# Patient Record
Sex: Male | Born: 1965 | Race: White | Hispanic: No | Marital: Single | Smoking: Never smoker
Health system: Southern US, Community
[De-identification: ages and names within clinical notes are randomized; demographics above are authoritative.]

---

## 2015-05-23 ENCOUNTER — Telehealth: Payer: Self-pay | Admitting: General Practice

## 2015-05-23 NOTE — Telephone Encounter (Signed)
Pt stopped by and is needing a doctor who speaks russian. Can you take him on as a new patient? Please advise

## 2015-05-23 NOTE — Telephone Encounter (Signed)
Ok Thx 

## 2015-05-24 NOTE — Telephone Encounter (Signed)
Unable to leave msg. vm not set up

## 2015-05-24 NOTE — Telephone Encounter (Signed)
Pt informed

## 2016-08-22 NOTE — Telephone Encounter (Signed)
Patient stopped in office wanting to know if you would still accept him as a patient. He never set up one before. Please advise. Thank you.

## 2016-08-31 NOTE — Telephone Encounter (Signed)
OK. Thx

## 2016-09-02 NOTE — Telephone Encounter (Signed)
Patient has an appointment on June 15 at 315pm to establish care.

## 2016-09-13 ENCOUNTER — Other Ambulatory Visit: Payer: 59

## 2016-09-13 ENCOUNTER — Ambulatory Visit (INDEPENDENT_AMBULATORY_CARE_PROVIDER_SITE_OTHER): Payer: 59 | Admitting: Internal Medicine

## 2016-09-13 ENCOUNTER — Encounter: Payer: Self-pay | Admitting: Internal Medicine

## 2016-09-13 VITALS — BP 108/74 | HR 79 | Temp 98.1°F | Ht 72.0 in | Wt 213.0 lb

## 2016-09-13 DIAGNOSIS — D485 Neoplasm of uncertain behavior of skin: Secondary | ICD-10-CM | POA: Diagnosis not present

## 2016-09-13 DIAGNOSIS — Z0000 Encounter for general adult medical examination without abnormal findings: Secondary | ICD-10-CM

## 2016-09-13 DIAGNOSIS — Z23 Encounter for immunization: Secondary | ICD-10-CM | POA: Diagnosis not present

## 2016-09-13 DIAGNOSIS — H0260 Xanthelasma of unspecified eye, unspecified eyelid: Secondary | ICD-10-CM | POA: Insufficient documentation

## 2016-09-13 DIAGNOSIS — R202 Paresthesia of skin: Secondary | ICD-10-CM | POA: Diagnosis not present

## 2016-09-13 NOTE — Assessment & Plan Note (Signed)
Skin bx 

## 2016-09-13 NOTE — Progress Notes (Signed)
   Subjective:  Patient ID: Craig Atkins, male    DOB: [birth year]  Age: 51 y.o. MRN: [id number]  CC: No chief complaint on file.   HPI Craig Atkins presents for a new pt well exam C/o L arm and hand numbness at work, better w/arm position change C/o skin lesions C/o LBP x 1 week - dull ache - resolved  No outpatient prescriptions prior to visit.   No facility-administered medications prior to visit.     ROS Review of Systems  Constitutional: Negative for appetite change, fatigue and unexpected weight change.  HENT: Negative for congestion, nosebleeds, sneezing, sore throat and trouble swallowing.   Eyes: Negative for itching and visual disturbance.  Respiratory: Negative for cough.   Cardiovascular: Negative for chest pain, palpitations and leg swelling.  Gastrointestinal: Negative for abdominal distention, blood in stool, diarrhea and nausea.  Genitourinary: Negative for frequency and hematuria.  Musculoskeletal: Positive for back pain. Negative for gait problem, joint swelling and neck pain.  Skin: Negative for rash.  Neurological: Positive for numbness. Negative for dizziness, tremors, speech difficulty and weakness.  Psychiatric/Behavioral: Negative for agitation, dysphoric mood and sleep disturbance. The patient is not nervous/anxious.     Objective:  BP 108/74 (BP Location: Left Arm, Patient Position: Sitting, Cuff Size: Normal)   Pulse 79   Temp 98.1 F (36.7 C) (Oral)   Ht 6' (1.829 m)   Wt 213 lb (96.6 kg)   SpO2 99%   BMI 28.89 kg/m   BP Readings from Last 3 Encounters:  09/13/16 108/74     Wt Readings from Last 3 Encounters:  09/13/16 213 lb (96.6 kg)    Physical Exam  Constitutional: He is oriented to person, place, and time. He appears well-developed. No distress.  NAD  HENT:  Mouth/Throat: Oropharynx is clear and moist.  Eyes: Conjunctivae are normal. Pupils are equal, round, and reactive to light.  Neck: Normal range of motion. No JVD present.  No thyromegaly present.  Cardiovascular: Normal rate, regular rhythm, normal heart sounds and intact distal pulses.  Exam reveals no gallop and no friction rub.   No murmur heard. Pulmonary/Chest: Effort normal and breath sounds normal. No respiratory distress. He has no wheezes. He has no rales. He exhibits no tenderness.  Abdominal: Soft. Bowel sounds are normal. He exhibits no distension and no mass. There is no tenderness. There is no rebound and no guarding.  Musculoskeletal: Normal range of motion. He exhibits no edema or tenderness.  Lymphadenopathy:    He has no cervical adenopathy.  Neurological: He is alert and oriented to person, place, and time. He has normal reflexes. No cranial nerve deficit. He exhibits normal muscle tone. He displays a negative Romberg sign. Coordination and gait normal.  Skin: Skin is warm and dry. No rash noted.  Psychiatric: He has a normal mood and affect. His behavior is normal. Judgment and thought content normal.  moles on neck Xanthelasma on L upper eyelid Gynecomastia B. Obese LS NT  No results found for: WBC, HGB, HCT, PLT, GLUCOSE, CHOL, TRIG, HDL, LDLDIRECT, LDLCALC, ALT, AST, NA, K, CL, CREATININE, BUN, CO2, TSH, PSA, INR, GLUF, HGBA1C, MICROALBUR  Patient was never admitted.  Assessment & Plan:   There are no diagnoses linked to this encounter. Craig Atkins does not currently have medications on file.  No orders of the defined types were placed in this encounter.    Follow-up: No Follow-up on file.  Walker Kehr, MD

## 2016-09-13 NOTE — Assessment & Plan Note (Addendum)
Compression neuropathy, ulnar Labs  Ergonomic work station

## 2016-09-13 NOTE — Assessment & Plan Note (Addendum)
  We discussed age appropriate health related issues, including available/recomended screening tests and vaccinations. We discussed a need for adhering to healthy diet and exercise. Labs were ordered to be later reviewed . All questions were answered.  tDAP

## 2016-09-13 NOTE — Patient Instructions (Addendum)
Compression neuropathy, ulnar  Skin bx w/me  [facility]

## 2016-09-13 NOTE — Assessment & Plan Note (Addendum)
L upper eyelid Labs Ophth ref suggested - Dr Alfonso Patten

## 2016-09-17 ENCOUNTER — Other Ambulatory Visit (INDEPENDENT_AMBULATORY_CARE_PROVIDER_SITE_OTHER): Payer: 59

## 2016-09-17 DIAGNOSIS — R202 Paresthesia of skin: Secondary | ICD-10-CM | POA: Diagnosis not present

## 2016-09-17 DIAGNOSIS — Z0000 Encounter for general adult medical examination without abnormal findings: Secondary | ICD-10-CM

## 2016-09-17 LAB — URINALYSIS
Bilirubin Urine: NEGATIVE
HGB URINE DIPSTICK: NEGATIVE
Ketones, ur: NEGATIVE
Leukocytes, UA: NEGATIVE
Nitrite: NEGATIVE
PH: 6 (ref 5.0–8.0)
SPECIFIC GRAVITY, URINE: 1.025 (ref 1.000–1.030)
TOTAL PROTEIN, URINE-UPE24: NEGATIVE
URINE GLUCOSE: NEGATIVE
Urobilinogen, UA: 0.2 (ref 0.0–1.0)

## 2016-09-17 LAB — CBC WITH DIFFERENTIAL/PLATELET
Basophils Absolute: 0 10*3/uL (ref 0.0–0.1)
Basophils Relative: 0.4 % (ref 0.0–3.0)
EOS PCT: 1 % (ref 0.0–5.0)
Eosinophils Absolute: 0.1 10*3/uL (ref 0.0–0.7)
HEMATOCRIT: 44.9 % (ref 39.0–52.0)
HEMOGLOBIN: 15.5 g/dL (ref 13.0–17.0)
LYMPHS PCT: 28.1 % (ref 12.0–46.0)
Lymphs Abs: 1.5 10*3/uL (ref 0.7–4.0)
MCHC: 34.5 g/dL (ref 30.0–36.0)
MCV: 89.6 fl (ref 78.0–100.0)
MONOS PCT: 9.1 % (ref 3.0–12.0)
Monocytes Absolute: 0.5 10*3/uL (ref 0.1–1.0)
Neutro Abs: 3.2 10*3/uL (ref 1.4–7.7)
Neutrophils Relative %: 61.4 % (ref 43.0–77.0)
Platelets: 204 10*3/uL (ref 150.0–400.0)
RBC: 5.01 Mil/uL (ref 4.22–5.81)
RDW: 13 % (ref 11.5–15.5)
WBC: 5.3 10*3/uL (ref 4.0–10.5)

## 2016-09-17 LAB — HEPATIC FUNCTION PANEL
ALT: 20 U/L (ref 0–53)
AST: 15 U/L (ref 0–37)
Albumin: 4.3 g/dL (ref 3.5–5.2)
Alkaline Phosphatase: 56 U/L (ref 39–117)
BILIRUBIN DIRECT: 0.1 mg/dL (ref 0.0–0.3)
TOTAL PROTEIN: 6.4 g/dL (ref 6.0–8.3)
Total Bilirubin: 0.6 mg/dL (ref 0.2–1.2)

## 2016-09-17 LAB — BASIC METABOLIC PANEL
BUN: 15 mg/dL (ref 6–23)
CHLORIDE: 105 meq/L (ref 96–112)
CO2: 30 meq/L (ref 19–32)
CREATININE: 0.85 mg/dL (ref 0.40–1.50)
Calcium: 9.4 mg/dL (ref 8.4–10.5)
GFR: 101.42 mL/min (ref >60.00)
Glucose, Bld: 108 mg/dL — ABNORMAL HIGH (ref 70–99)
POTASSIUM: 3.9 meq/L (ref 3.5–5.1)
Sodium: 142 mEq/L (ref 135–145)

## 2016-09-17 LAB — LIPID PANEL
CHOL/HDL RATIO: 6
Cholesterol: 217 mg/dL — ABNORMAL HIGH (ref 0–200)
HDL: 35.9 mg/dL — AB (ref >39.00)
LDL CALC: 147 mg/dL — AB (ref 0–99)
NonHDL: 181.15
Triglycerides: 169 mg/dL — ABNORMAL HIGH (ref 0.0–149.0)
VLDL: 33.8 mg/dL (ref 0.0–40.0)

## 2016-09-17 LAB — TSH: TSH: 0.63 u[IU]/mL (ref 0.35–4.50)

## 2016-09-17 LAB — VITAMIN B12: Vitamin B-12: 182 pg/mL — ABNORMAL LOW (ref 211–911)

## 2016-09-17 LAB — PSA: PSA: 2.1 ng/mL (ref 0.10–4.00)

## 2016-09-23 ENCOUNTER — Other Ambulatory Visit: Payer: Self-pay | Admitting: Internal Medicine

## 2016-09-23 MED ORDER — CYANOCOBALAMIN 500 MCG/0.1ML NA SOLN: 500.0000 ug | NASAL | 11 refills | Status: DC

## 2016-09-23 MED ORDER — VITAMIN D3 50 MCG (2000 UT) PO CAPS: 2000.0000 [IU] | ORAL_CAPSULE | Freq: Every day | ORAL | 3 refills | Status: DC

## 2016-10-15 ENCOUNTER — Ambulatory Visit (INDEPENDENT_AMBULATORY_CARE_PROVIDER_SITE_OTHER): Payer: 59 | Admitting: Internal Medicine

## 2016-10-15 ENCOUNTER — Other Ambulatory Visit: Payer: 59

## 2016-10-15 ENCOUNTER — Encounter: Payer: Self-pay | Admitting: Internal Medicine

## 2016-10-15 VITALS — BP 110/72 | HR 54 | Temp 99.0°F | Ht 72.0 in | Wt 209.0 lb

## 2016-10-15 DIAGNOSIS — D485 Neoplasm of uncertain behavior of skin: Secondary | ICD-10-CM | POA: Diagnosis not present

## 2016-10-15 DIAGNOSIS — E538 Deficiency of other specified B group vitamins: Secondary | ICD-10-CM | POA: Diagnosis not present

## 2016-10-15 NOTE — Assessment & Plan Note (Signed)
On B12 SL

## 2016-10-15 NOTE — Progress Notes (Signed)
Subjective:  Patient ID: Craig Atkins, male    DOB: 1966-01-31  Age: 51 y.o. MRN: [id number]  CC: No chief complaint on file.   HPI Craig Atkins presents for B12 def f/u and for a skin bx  Outpatient Medications Prior to Visit  Medication Sig Dispense Refill  . Cholecalciferol (VITAMIN D3) 2000 units capsule Take 1 capsule (2,000 Units total) by mouth daily. 100 capsule 3  . Cyanocobalamin (NASCOBAL) 500 MCG/0.1ML SOLN Place 0.1 mLs (500 mcg total) into the nose once a week. 1.3 mL 11   No facility-administered medications prior to visit.     ROS Review of Systems  Constitutional: Negative for fatigue and fever.  Musculoskeletal: Negative for arthralgias and myalgias.    Objective:  BP 110/72 (BP Location: Left Arm, Patient Position: Sitting, Cuff Size: Large)   Pulse (!) 54   Temp 99 F (37.2 C) (Oral)   Ht 6' (1.829 m)   Wt 209 lb (94.8 kg)   SpO2 99%   BMI 28.35 kg/m   BP Readings from Last 3 Encounters:  10/15/16 110/72  09/13/16 108/74    Wt Readings from Last 3 Encounters:  10/15/16 209 lb (94.8 kg)  09/13/16 213 lb (96.6 kg)    Physical Exam  Constitutional: No distress.  Musculoskeletal: He exhibits no tenderness.  Skin: No rash noted. No erythema. No pallor.    Procedure Note :     Procedure :  Skin biopsy   Indication: Suspicious lesion(s) and tags   Risks including unsuccessful procedure , bleeding, infection, bruising, scar, a need for another complete procedure and others were explained to the patient in detail as well as the benefits. Informed consent was obtained.  The patient was placed in a decubitus position.  Lesion #1 on  L neck   measuring   6x3  mm   Skin over lesion #1  was prepped with Betadine and alcohol  and anesthetized with 1/2 cc of 2% lidocaine and epinephrine, using a 25-gauge 1 inch needle.  Shave biopsy with a sterile Dermablade was carried out in the usual fashion. Hyfrecator was used to destroy the rest of the  lesion potentially left behind and for hemostasis. Band-Aid was applied with antibiotic ointment.    Lesion #2 on  L neck   measuring 2x2  mm   Skin over lesion #2  was prepped with Betadine and alcohol  and anesthetized with 1 cc of 2% lidocaine and epinephrine, using a 25-gauge 1 inch needle.  Shave biopsy with a sterile Dermablade was carried out in the usual fashion. Hyfrecator was used to destroy the rest of the lesion potentially left behind and for hemostasis. Band-Aid was applied with antibiotic ointment. Lesion discarded.  Lesion #3-6 on R neck   measuring  1x1 mm removed w/hyfracator at no charge     Tolerated well. Complications none.    Lab Results  Component Value Date   WBC 5.3 09/17/2016   HGB 15.5 09/17/2016   HCT 44.9 09/17/2016   PLT 204.0 09/17/2016   GLUCOSE 108 (H) 09/17/2016   CHOL 217 (H) 09/17/2016   TRIG 169.0 (H) 09/17/2016   HDL 35.90 (L) 09/17/2016   LDLCALC 147 (H) 09/17/2016   ALT 20 09/17/2016   AST 15 09/17/2016   NA 142 09/17/2016   K 3.9 09/17/2016   CL 105 09/17/2016   CREATININE 0.85 09/17/2016   BUN 15 09/17/2016   CO2 30 09/17/2016   TSH 0.63 09/17/2016   PSA 2.10 09/17/2016  Patient was never admitted.  Assessment & Plan:   There are no diagnoses linked to this encounter. I am having Mr. Dorame maintain his Vitamin D3 and Cyanocobalamin.  Meds ordered this encounter  Medications  . Cyanocobalamin 2500 MCG SUBL    Sig: Use as directed in the mouth or throat daily.     Follow-up: No Follow-up on file.  Walker Kehr, MD

## 2016-10-15 NOTE — Assessment & Plan Note (Signed)
See procedure Tags removed at no charge

## 2016-10-16 ENCOUNTER — Telehealth: Payer: Self-pay | Admitting: Internal Medicine

## 2016-10-16 NOTE — Telephone Encounter (Signed)
The lab called needing the location of the skin biopsy. Sh was informed it was the L side of the neck.

## 2017-06-30 ENCOUNTER — Other Ambulatory Visit: Payer: Self-pay | Admitting: Internal Medicine

## 2017-09-16 ENCOUNTER — Ambulatory Visit (INDEPENDENT_AMBULATORY_CARE_PROVIDER_SITE_OTHER): Payer: 59 | Admitting: Internal Medicine

## 2017-09-16 ENCOUNTER — Encounter: Payer: Self-pay | Admitting: Internal Medicine

## 2017-09-16 ENCOUNTER — Other Ambulatory Visit (INDEPENDENT_AMBULATORY_CARE_PROVIDER_SITE_OTHER): Payer: 59

## 2017-09-16 ENCOUNTER — Ambulatory Visit (INDEPENDENT_AMBULATORY_CARE_PROVIDER_SITE_OTHER)
Admission: RE | Admit: 2017-09-16 | Discharge: 2017-09-16 | Disposition: A | Discharge status: Home / Self Care | Payer: 59 | Source: Ambulatory Visit | Attending: Internal Medicine | Admitting: Internal Medicine

## 2017-09-16 VITALS — BP 108/72 | HR 58 | Temp 97.7°F | Ht 72.0 in | Wt 205.0 lb

## 2017-09-16 DIAGNOSIS — R05 Cough: Secondary | ICD-10-CM | POA: Insufficient documentation

## 2017-09-16 DIAGNOSIS — E538 Deficiency of other specified B group vitamins: Secondary | ICD-10-CM | POA: Diagnosis not present

## 2017-09-16 DIAGNOSIS — J301 Allergic rhinitis due to pollen: Secondary | ICD-10-CM | POA: Diagnosis not present

## 2017-09-16 DIAGNOSIS — Z0000 Encounter for general adult medical examination without abnormal findings: Secondary | ICD-10-CM

## 2017-09-16 DIAGNOSIS — J309 Allergic rhinitis, unspecified: Secondary | ICD-10-CM | POA: Insufficient documentation

## 2017-09-16 DIAGNOSIS — R059 Cough, unspecified: Secondary | ICD-10-CM

## 2017-09-16 LAB — BASIC METABOLIC PANEL
BUN: 20 mg/dL (ref 6–23)
CO2: 29 mEq/L (ref 19–32)
CREATININE: 0.89 mg/dL (ref 0.40–1.50)
Calcium: 9.6 mg/dL (ref 8.4–10.5)
Chloride: 106 mEq/L (ref 96–112)
GFR: 95.8 mL/min (ref >60.00)
Glucose, Bld: 86 mg/dL (ref 70–99)
POTASSIUM: 3.8 meq/L (ref 3.5–5.1)
Sodium: 142 mEq/L (ref 135–145)

## 2017-09-16 LAB — CBC WITH DIFFERENTIAL/PLATELET
Basophils Absolute: 0 10*3/uL (ref 0.0–0.1)
Basophils Relative: 0.5 % (ref 0.0–3.0)
Eosinophils Absolute: 0.1 10*3/uL (ref 0.0–0.7)
Eosinophils Relative: 1.3 % (ref 0.0–5.0)
HCT: 46.2 % (ref 39.0–52.0)
Hemoglobin: 16.2 g/dL (ref 13.0–17.0)
LYMPHS ABS: 1.7 10*3/uL (ref 0.7–4.0)
Lymphocytes Relative: 27.1 % (ref 12.0–46.0)
MCHC: 35.1 g/dL (ref 30.0–36.0)
MCV: 89.9 fl (ref 78.0–100.0)
MONO ABS: 0.6 10*3/uL (ref 0.1–1.0)
Monocytes Relative: 9.7 % (ref 3.0–12.0)
NEUTROS ABS: 3.8 10*3/uL (ref 1.4–7.7)
NEUTROS PCT: 61.4 % (ref 43.0–77.0)
PLATELETS: 199 10*3/uL (ref 150.0–400.0)
RBC: 5.14 Mil/uL (ref 4.22–5.81)
RDW: 13.3 % (ref 11.5–15.5)
WBC: 6.2 10*3/uL (ref 4.0–10.5)

## 2017-09-16 LAB — HEPATIC FUNCTION PANEL
ALBUMIN: 4.5 g/dL (ref 3.5–5.2)
ALT: 24 U/L (ref 0–53)
AST: 13 U/L (ref 0–37)
Alkaline Phosphatase: 63 U/L (ref 39–117)
BILIRUBIN DIRECT: 0.1 mg/dL (ref 0.0–0.3)
Total Bilirubin: 0.5 mg/dL (ref 0.2–1.2)
Total Protein: 7 g/dL (ref 6.0–8.3)

## 2017-09-16 LAB — URINALYSIS
Bilirubin Urine: NEGATIVE
Hgb urine dipstick: NEGATIVE
Ketones, ur: NEGATIVE
Leukocytes, UA: NEGATIVE
Nitrite: NEGATIVE
PH: 5.5 (ref 5.0–8.0)
TOTAL PROTEIN, URINE-UPE24: NEGATIVE
URINE GLUCOSE: NEGATIVE
Urobilinogen, UA: 0.2 (ref 0.0–1.0)

## 2017-09-16 LAB — H. PYLORI ANTIBODY, IGG: H PYLORI IGG: NEGATIVE

## 2017-09-16 LAB — VITAMIN B12: Vitamin B-12: 945 pg/mL — ABNORMAL HIGH (ref 211–911)

## 2017-09-16 LAB — LIPID PANEL
CHOLESTEROL: 234 mg/dL — AB (ref 0–200)
HDL: 41.5 mg/dL (ref >39.00)
NonHDL: 192.33
TRIGLYCERIDES: 210 mg/dL — AB (ref 0.0–149.0)
Total CHOL/HDL Ratio: 6
VLDL: 42 mg/dL — ABNORMAL HIGH (ref 0.0–40.0)

## 2017-09-16 LAB — PSA: PSA: 2.69 ng/mL (ref 0.10–4.00)

## 2017-09-16 LAB — VITAMIN D 25 HYDROXY (VIT D DEFICIENCY, FRACTURES): VITD: 21.94 ng/mL — AB (ref 30.00–100.00)

## 2017-09-16 LAB — LDL CHOLESTEROL, DIRECT: Direct LDL: 155 mg/dL

## 2017-09-16 LAB — TSH: TSH: 0.7 u[IU]/mL (ref 0.35–4.50)

## 2017-09-16 MED ORDER — LORATADINE 10 MG PO TABS: 10.0000 mg | ORAL_TABLET | Freq: Every day | ORAL | 11 refills | Status: DC

## 2017-09-16 MED ORDER — FLUTICASONE FUROATE-VILANTEROL 100-25 MCG/INH IN AEPB: 1.0000 | INHALATION_SPRAY | Freq: Every day | RESPIRATORY_TRACT | 5 refills | Status: DC

## 2017-09-16 NOTE — Progress Notes (Signed)
Subjective:  Patient ID: Craig Atkins, male    DOB: 1966-02-24  Age: 52 y.o. MRN: [id number]  CC: No chief complaint on file.   HPI Craig Atkins presents for a well exam C/o several episodes of cough and chills periodically  Outpatient Medications Prior to Visit  Medication Sig Dispense Refill  . [employer] D3 2000 units CAPS TAKE ONE CAPSULE BY MOUTH EVERY DAY 90 capsule 3  . Cyanocobalamin 2500 MCG SUBL Use as directed in the mouth or throat daily.     No facility-administered medications prior to visit.     ROS: Review of Systems  Constitutional: Negative for appetite change, fatigue and unexpected weight change.  HENT: Negative for congestion, nosebleeds, sneezing, sore throat and trouble swallowing.   Eyes: Negative for itching and visual disturbance.  Respiratory: Positive for cough.   Cardiovascular: Negative for chest pain, palpitations and leg swelling.  Gastrointestinal: Negative for abdominal distention, blood in stool, diarrhea and nausea.  Genitourinary: Negative for frequency and hematuria.  Musculoskeletal: Negative for back pain, gait problem, joint swelling and neck pain.  Skin: Negative for rash.  Neurological: Negative for dizziness, tremors, speech difficulty and weakness.  Psychiatric/Behavioral: Negative for agitation, dysphoric mood, sleep disturbance and suicidal ideas. The patient is not nervous/anxious.     Objective:  BP 108/72 (BP Location: Left Arm, Patient Position: Sitting, Cuff Size: Large)   Pulse (!) 58   Temp 97.7 F (36.5 C) (Oral)   Ht 6' (1.829 m)   Wt 205 lb (93 kg)   SpO2 99%   BMI 27.80 kg/m   BP Readings from Last 3 Encounters:  09/16/17 108/72  10/15/16 110/72  09/13/16 108/74    Wt Readings from Last 3 Encounters:  09/16/17 205 lb (93 kg)  10/15/16 209 lb (94.8 kg)  09/13/16 213 lb (96.6 kg)    Physical Exam  Constitutional: He is oriented to person, place, and time. He appears well-developed. No distress.  NAD    HENT:  Mouth/Throat: Oropharynx is clear and moist.  Eyes: Pupils are equal, round, and reactive to light. Conjunctivae are normal.  Neck: Normal range of motion. No JVD present. No thyromegaly present.  Cardiovascular: Normal rate, regular rhythm, normal heart sounds and intact distal pulses. Exam reveals no gallop and no friction rub.  No murmur heard. Pulmonary/Chest: Effort normal and breath sounds normal. No respiratory distress. He has no wheezes. He has no rales. He exhibits no tenderness.  Abdominal: Soft. Bowel sounds are normal. He exhibits no distension and no mass. There is no tenderness. There is no rebound and no guarding.  Musculoskeletal: Normal range of motion. He exhibits no edema or tenderness.  Lymphadenopathy:    He has no cervical adenopathy.  Neurological: He is alert and oriented to person, place, and time. He has normal reflexes. No cranial nerve deficit. He exhibits normal muscle tone. He displays a negative Romberg sign. Coordination and gait normal.  Skin: Skin is warm and dry. No rash noted.  Psychiatric: He has a normal mood and affect. His behavior is normal. Judgment and thought content normal.    Lab Results  Component Value Date   WBC 5.3 09/17/2016   HGB 15.5 09/17/2016   HCT 44.9 09/17/2016   PLT 204.0 09/17/2016   GLUCOSE 108 (H) 09/17/2016   CHOL 217 (H) 09/17/2016   TRIG 169.0 (H) 09/17/2016   HDL 35.90 (L) 09/17/2016   LDLCALC 147 (H) 09/17/2016   ALT 20 09/17/2016   AST 15 09/17/2016  NA 142 09/17/2016   K 3.9 09/17/2016   CL 105 09/17/2016   CREATININE 0.85 09/17/2016   BUN 15 09/17/2016   CO2 30 09/17/2016   TSH 0.63 09/17/2016   PSA 2.10 09/17/2016    Patient was never admitted.  Assessment & Plan:   There are no diagnoses linked to this encounter.   No orders of the defined types were placed in this encounter.    Follow-up: No follow-ups on file.  Walker Kehr, MD

## 2017-09-16 NOTE — Assessment & Plan Note (Addendum)
We discussed age appropriate health related issues, including available/recomended screening tests and vaccinations. We discussed a need for adhering to healthy diet and exercise. Labs were ordered to be later reviewed . All questions were answered.  cologuard  CXR

## 2017-09-16 NOTE — Assessment & Plan Note (Addendum)
Claritin prn Nasal irrigation

## 2017-09-16 NOTE — Assessment & Plan Note (Signed)
episodic - ?asthmatic

## 2017-09-16 NOTE — Assessment & Plan Note (Signed)
On B12 

## 2017-09-18 ENCOUNTER — Other Ambulatory Visit: Payer: Self-pay | Admitting: Internal Medicine

## 2017-09-18 MED ORDER — VITAMIN D3 1.25 MG (50000 UT) PO CAPS: 1.0000 | ORAL_CAPSULE | ORAL | 0 refills | Status: DC

## 2017-10-29 LAB — COLOGUARD: Cologuard: NEGATIVE

## 2017-11-02 ENCOUNTER — Other Ambulatory Visit: Payer: Self-pay | Admitting: Internal Medicine

## 2018-11-25 ENCOUNTER — Encounter: Payer: Self-pay | Admitting: Internal Medicine

## 2018-11-25 ENCOUNTER — Ambulatory Visit (INDEPENDENT_AMBULATORY_CARE_PROVIDER_SITE_OTHER): Payer: 59 | Admitting: Internal Medicine

## 2018-11-25 ENCOUNTER — Other Ambulatory Visit: Payer: Self-pay

## 2018-11-25 VITALS — BP 104/70 | HR 65 | Temp 97.7°F | Ht 72.0 in | Wt 205.0 lb

## 2018-11-25 DIAGNOSIS — M25512 Pain in left shoulder: Secondary | ICD-10-CM

## 2018-11-25 DIAGNOSIS — Z23 Encounter for immunization: Secondary | ICD-10-CM

## 2018-11-25 DIAGNOSIS — E785 Hyperlipidemia, unspecified: Secondary | ICD-10-CM

## 2018-11-25 DIAGNOSIS — Z0000 Encounter for general adult medical examination without abnormal findings: Secondary | ICD-10-CM | POA: Diagnosis not present

## 2018-11-25 DIAGNOSIS — M25519 Pain in unspecified shoulder: Secondary | ICD-10-CM | POA: Insufficient documentation

## 2018-11-25 DIAGNOSIS — M722 Plantar fascial fibromatosis: Secondary | ICD-10-CM

## 2018-11-25 DIAGNOSIS — H0260 Xanthelasma of unspecified eye, unspecified eyelid: Secondary | ICD-10-CM | POA: Diagnosis not present

## 2018-11-25 DIAGNOSIS — E538 Deficiency of other specified B group vitamins: Secondary | ICD-10-CM

## 2018-11-25 NOTE — Assessment & Plan Note (Signed)
L upper eyelid

## 2018-11-25 NOTE — Assessment & Plan Note (Signed)
Ice Voltaren gel Massage

## 2018-11-25 NOTE — Patient Instructions (Addendum)
Ice Voltaren gel Ibuprofen Massage   Plantar Fasciitis  Plantar fasciitis is a painful foot condition that affects the heel. It occurs when the band of tissue that connects the toes to the heel bone (plantar fascia) becomes irritated. This can happen as the result of exercising too much or doing other repetitive activities (overuse injury). The pain from plantar fasciitis can range from mild irritation to severe pain that makes it difficult to walk or move. The pain is usually worse in the morning after sleeping, or after sitting or lying down for a while. Pain may also be worse after long periods of walking or standing. What are the causes? This condition may be caused by:  Standing for long periods of time.  Wearing shoes that do not have good arch support.  Doing activities that put stress on joints (high-impact activities), including running, aerobics, and ballet.  Being overweight.  An abnormal way of walking (gait).  Tight muscles in the back of your lower leg (calf).  High arches in your feet.  Starting a new athletic activity. What are the signs or symptoms? The main symptom of this condition is heel pain. Pain may:  Be worse with first steps after a time of rest, especially in the morning after sleeping or after you have been sitting or lying down for a while.  Be worse after long periods of standing still.  Decrease after 30-45 minutes of activity, such as gentle walking. How is this diagnosed? This condition may be diagnosed based on your medical history and your symptoms. Your health care provider may ask questions about your activity level. Your health care provider will do a physical exam to check for:  A tender area on the bottom of your foot.  A high arch in your foot.  Pain when you move your foot.  Difficulty moving your foot. You may have imaging tests to confirm the diagnosis, such as:  X-rays.  Ultrasound.  MRI. How is this  treated? Treatment for plantar fasciitis depends on how severe your condition is. Treatment may include:  Rest, ice, applying pressure (compression), and raising the affected foot (elevation). This may be called RICE therapy. Your health care provider may recommend RICE therapy along with over-the-counter pain medicines to manage your pain.  Exercises to stretch your calves and your plantar fascia.  A splint that holds your foot in a stretched, upward position while you sleep (night splint).  Physical therapy to relieve symptoms and prevent problems in the future.  Injections of steroid medicine (cortisone) to relieve pain and inflammation.  Stimulating your plantar fascia with electrical impulses (extracorporeal shock wave therapy). This is usually the last treatment option before surgery.  Surgery, if other treatments have not worked after 12 months. Follow these instructions at home:  Managing pain, stiffness, and swelling  If directed, put ice on the painful area: ? Put ice in a plastic bag, or use a frozen bottle of water. ? Place a towel between your skin and the bag or bottle. ? Roll the bottom of your foot over the bag or bottle. ? Do this for 20 minutes, 2-3 times a day.  Wear athletic shoes that have air-sole or gel-sole cushions, or try wearing soft shoe inserts that are designed for plantar fasciitis.  Raise (elevate) your foot above the level of your heart while you are sitting or lying down. Activity  Avoid activities that cause pain. Ask your health care provider what activities are safe for you.  Do  physical therapy exercises and stretches as told by your health care provider.  Try activities and forms of exercise that are easier on your joints (low-impact). Examples include swimming, water aerobics, and biking. General instructions  Take over-the-counter and prescription medicines only as told by your health care provider.  Wear a night splint while sleeping,  if told by your health care provider. Loosen the splint if your toes tingle, become numb, or turn cold and blue.  Maintain a healthy weight, or work with your health care provider to lose weight as needed.  Keep all follow-up visits as told by your health care provider. This is important. Contact a health care provider if you:  Have symptoms that do not go away after caring for yourself at home.  Have pain that gets worse.  Have pain that affects your ability to move or do your daily activities. Summary  Plantar fasciitis is a painful foot condition that affects the heel. It occurs when the band of tissue that connects the toes to the heel bone (plantar fascia) becomes irritated.  The main symptom of this condition is heel pain that may be worse after exercising too much or standing still for a long time.  Treatment varies, but it usually starts with rest, ice, compression, and elevation (RICE therapy) and over-the-counter medicines to manage pain. This information is not intended to replace advice given to you by your health care provider. Make sure you discuss any questions you have with your health care provider. Document Released: 12/11/2000 Document Revised: 02/28/2017 Document Reviewed: 01/13/2017 Elsevier Patient Education  2020 [employer].    These suggestions will probably help you to improve your metabolism if you are not overweight and to lose weight if you are overweight: 1.  Reduce your consumption of sugars and starches.  Eliminate high fructose corn syrup from your diet.  Reduce your consumption of processed foods.  For desserts try to have seasonal fruits, berries, nuts, cheeses or dark chocolate with more than 70% cacao. 2.  Do not snack 3.  You do not have to eat breakfast.  If you choose to have breakfast-eat plain greek yogurt, eggs, oatmeal (without sugar) 4.  Drink water, freshly brewed unsweetened tea (green, black or herbal) or coffee.  Do not drink sodas  including diet sodas , juices, beverages sweetened with artificial sweeteners. 5.  Reduce your consumption of refined grains. 6.  Avoid protein drinks such as Optifast, Slim fast etc. Eat chicken, fish, meat, dairy and beans for your sources of protein 7.  Natural unprocessed fats like cold pressed virgin olive oil, butter, coconut oil are good for you.  Eat avocados 8.  Increase your consumption of fiber.  Fruits, berries, vegetables, whole grains, flaxseeds, Chia seeds, beans, popcorn, nuts, oatmeal are good sources of fiber 9.  Use vinegar in your diet, i.e. apple cider vinegar, red wine or balsamic vinegar 10.  You can try fasting.  For example you can skip breakfast and lunch every other day (24-hour fast) 11.  Stress reduction, good night sleep, relaxation, meditation, yoga and other physical activity is likely to help you to maintain low weight too. 12.  If you drink alcohol, limit your alcohol intake to no more than 2 drinks a day.   Mediterranean diet is good for you. ([employer] has a typical Mediterranean cuisine menu) The Mediterranean diet is a way of eating based on the traditional cuisine of countries bordering the [employer]. While there is no single definition of the  Mediterranean diet, it is typically high in vegetables, fruits, whole grains, beans, nut and seeds, and olive oil. The main components of Mediterranean diet include: Marland Kitchen Daily consumption of vegetables, fruits, whole grains and healthy fats  . Weekly intake of fish, poultry, beans and eggs  . Moderate portions of dairy products  . Limited intake of red meat Other important elements of the Mediterranean diet are sharing meals with family and friends, enjoying a glass of red wine and being physically active. Health benefits of a Mediterranean diet: A traditional Mediterranean diet consisting of large quantities of fresh fruits and vegetables, nuts, fish and olive oil-coupled with physical activity-can reduce  your risk of serious mental and physical health problems by: Preventing heart disease and strokes. Following a Mediterranean diet limits your intake of refined breads, processed foods, and red meat, and encourages drinking red wine instead of hard liquor-all factors that can help prevent heart disease and stroke. Keeping you agile. If you're an older adult, the nutrients gained with a Mediterranean diet may reduce your risk of developing muscle weakness and other signs of frailty by about 70 percent. Reducing the risk of Alzheimer's. Research suggests that the [place] diet may improve cholesterol, blood sugar levels, and overall blood vessel health, which in turn may reduce your risk of Alzheimer's disease or dementia. Halving the risk of Parkinson's disease. The high levels of antioxidants in the Mediterranean diet can prevent cells from undergoing a damaging process called oxidative stress, thereby cutting the risk of Parkinson's disease in half. Increasing longevity. By reducing your risk of developing heart disease or cancer with the Mediterranean diet, you're reducing your risk of death at any age by 20%. Protecting against type 2 diabetes. A Mediterranean diet is rich in fiber which digests slowly, prevents huge swings in blood sugar, and can help you maintain a healthy weight.    Cabbage soup recipe that will not make you gain weight: Take 1 small head of cabbage, 1 average pack of celery, 4 green peppers, 4 onions, 2 cans diced tomatoes (they are not available without salt), salt and spices to taste.  Chop cabbage, celery, peppers and onions.  And tomatoes and 2-2.5 liters (2.5 quarts) of water so that it would just cover the vegetables.  Bring to boil.  Add spices and salt.  Turn heat to low/medium and simmer for 20-25 minutes.  Naturally, you can make a smaller batch and change some of the ingredients.   Cardiac CT calcium scoring test $150   Computed tomography, more commonly known  as a CT or CAT scan, is a diagnostic medical imaging test. Like traditional x-rays, it produces multiple images or pictures of the inside of the body. The cross-sectional images generated during a CT scan can be reformatted in multiple planes. They can even generate three-dimensional images. These images can be viewed on a computer monitor, printed on film or by a 3D printer, or transferred to a CD or DVD. CT images of internal organs, bones, soft tissue and blood vessels provide greater detail than traditional x-rays, particularly of soft tissues and blood vessels. A cardiac CT scan for coronary calcium is a non-invasive way of obtaining information about the presence, location and extent of calcified plaque in the coronary arteries-the vessels that supply oxygen-containing blood to the heart muscle. Calcified plaque results when there is a build-up of fat and other substances under the inner layer of the artery. This material can calcify which signals the presence of atherosclerosis, a disease of  the vessel wall, also called coronary artery disease (CAD). People with this disease have an increased risk for heart attacks. In addition, over time, progression of plaque build up (CAD) can narrow the arteries or even close off blood flow to the heart. The result may be chest pain, sometimes called "angina," or a heart attack. Because calcium is a marker of CAD, the amount of calcium detected on a cardiac CT scan is a helpful prognostic tool. The findings on cardiac CT are expressed as a calcium score. Another name for this test is coronary artery calcium scoring.  What are some common uses of the procedure? The goal of cardiac CT scan for calcium scoring is to determine if CAD is present and to what extent, even if there are no symptoms. It is a screening study that may be recommended by a physician for patients with risk factors for CAD but no clinical symptoms. The major risk factors for CAD are: . high  blood cholesterol levels  . family history of heart attacks  . diabetes  . high blood pressure  . cigarette smoking  . overweight or obese  . physical inactivity   A negative cardiac CT scan for calcium scoring shows no calcification within the coronary arteries. This suggests that CAD is absent or so minimal it cannot be seen by this technique. The chance of having a heart attack over the next two to five years is very low under these circumstances. A positive test means that CAD is present, regardless of whether or not the patient is experiencing any symptoms. The amount of calcification-expressed as the calcium score-may help to predict the likelihood of a myocardial infarction (heart attack) in the coming years and helps your medical doctor or cardiologist decide whether the patient may need to take preventive medicine or undertake other measures such as diet and exercise to lower the risk for heart attack. The extent of CAD is graded according to your calcium score:  Calcium Score  Presence of CAD (coronary artery disease)  0 No evidence of CAD   1-10 Minimal evidence of CAD  11-100 Mild evidence of CAD  101-400 Moderate evidence of CAD  Over 400 Extensive evidence of CAD

## 2018-11-25 NOTE — Progress Notes (Addendum)
Subjective:  Patient ID: Craig Atkins, male    DOB: 11/28/1965  Age: 53 y.o. MRN: [id number]  CC: No chief complaint on file.   HPI Craig Atkins  [facility] male presents for a L shoulder injury in 3/20 - now w/pain. It is better w/youtube exercises C/o L heel pain x 1.5 mo, worse in am Follow-up for vitamin B12 deficiency  Outpatient Medications Prior to Visit  Medication Sig Dispense Refill  . Cholecalciferol (VITAMIN D3) 50000 units CAPS Take 1 capsule by mouth once a week. 6 capsule 0  . [facility] D3 2000 units CAPS TAKE ONE CAPSULE BY MOUTH EVERY DAY 90 capsule 3  . Cyanocobalamin 2500 MCG SUBL Use as directed in the mouth or throat daily.    . fluticasone furoate-vilanterol (BREO ELLIPTA) 100-25 MCG/INH AEPB Inhale 1 puff into the lungs daily. 1 each 5  . loratadine (CLARITIN) 10 MG tablet Take 1 tablet (10 mg total) by mouth daily. 30 tablet 11   No facility-administered medications prior to visit.     ROS: Review of Systems  Constitutional: Negative for appetite change, fatigue and unexpected weight change.  HENT: Negative for congestion, nosebleeds, sneezing, sore throat and trouble swallowing.   Eyes: Negative for itching and visual disturbance.  Respiratory: Negative for cough.   Cardiovascular: Negative for chest pain, palpitations and leg swelling.  Gastrointestinal: Negative for abdominal distention, blood in stool, diarrhea and nausea.  Genitourinary: Negative for frequency and hematuria.  Musculoskeletal: Positive for arthralgias. Negative for back pain, gait problem, joint swelling and neck pain.  Skin: Negative for rash.  Neurological: Negative for dizziness, tremors, speech difficulty and weakness.  Psychiatric/Behavioral: Negative for agitation, dysphoric mood, sleep disturbance and suicidal ideas. The patient is not nervous/anxious.     Objective:  Ht 6' (1.829 m)   Wt 205 lb (93 kg)   BMI 27.80 kg/m   BP Readings from Last 3 Encounters:  09/16/17 108/72   10/15/16 110/72  09/13/16 108/74    Wt Readings from Last 3 Encounters:  11/25/18 205 lb (93 kg)  09/16/17 205 lb (93 kg)  10/15/16 209 lb (94.8 kg)    Physical Exam Constitutional:      General: He is not in acute distress.    Appearance: He is well-developed.     Comments: NAD  Eyes:     Conjunctiva/sclera: Conjunctivae normal.     Pupils: Pupils are equal, round, and reactive to light.  Neck:     Musculoskeletal: Normal range of motion.     Thyroid: No thyromegaly.     Vascular: No JVD.  Cardiovascular:     Rate and Rhythm: Normal rate and regular rhythm.     Heart sounds: Normal heart sounds. No murmur. No friction rub. No gallop.   Pulmonary:     Effort: Pulmonary effort is normal. No respiratory distress.     Breath sounds: Normal breath sounds. No wheezing or rales.  Chest:     Chest wall: No tenderness.  Abdominal:     General: Bowel sounds are normal. There is no distension.     Palpations: Abdomen is soft. There is no mass.     Tenderness: There is no abdominal tenderness. There is no guarding or rebound.  Musculoskeletal: Normal range of motion.        General: Tenderness present.  Lymphadenopathy:     Cervical: No cervical adenopathy.  Skin:    General: Skin is warm and dry.     Findings: No rash.  Neurological:  Mental Status: He is alert and oriented to person, place, and time.     Cranial Nerves: No cranial nerve deficit.     Motor: No abnormal muscle tone.     Coordination: Coordination normal.     Gait: Gait normal.     Deep Tendon Reflexes: Reflexes are normal and symmetric.  Psychiatric:        Behavior: Behavior normal.        Thought Content: Thought content normal.        Judgment: Judgment normal.   Left heel is tender to palpation Left shoulder is tender with range of motion.  Biceps tendon is tender to palpation  Lab Results  Component Value Date   WBC 6.2 09/16/2017   HGB 16.2 09/16/2017   HCT 46.2 09/16/2017   PLT 199.0  09/16/2017   GLUCOSE 86 09/16/2017   CHOL 234 (H) 09/16/2017   TRIG 210.0 (H) 09/16/2017   HDL 41.50 09/16/2017   LDLDIRECT 155.0 09/16/2017   LDLCALC 147 (H) 09/17/2016   ALT 24 09/16/2017   AST 13 09/16/2017   NA 142 09/16/2017   K 3.8 09/16/2017   CL 106 09/16/2017   CREATININE 0.89 09/16/2017   BUN 20 09/16/2017   CO2 29 09/16/2017   TSH 0.70 09/16/2017   PSA 2.69 09/16/2017    Dg Chest 2 View  Result Date: 09/16/2017 CLINICAL DATA:  Nonproductive cough intermittently for the past 2 months EXAM: CHEST - 2 VIEW COMPARISON:  None in PACs FINDINGS: The lungs are adequately inflated. The interstitial markings are mildly prominent bilaterally. There is no alveolar infiltrate or pleural effusion. The heart and pulmonary vascularity are normal. The mediastinum is normal in width. The bony thorax exhibits no acute abnormality. IMPRESSION: Mild interstitial prominence may reflect acute or chronic bronchitic changes. There is no alveolar pneumonia, CHF, nor other acute cardiopulmonary abnormality. Electronically Signed   By: David  Martinique M.D.   On: 09/16/2017 09:21    Assessment & Plan:   There are no diagnoses linked to this encounter.   No orders of the defined types were placed in this encounter.    Follow-up: No follow-ups on file.  Walker Kehr, MD

## 2018-11-25 NOTE — Assessment & Plan Note (Signed)
On B12 

## 2019-01-06 ENCOUNTER — Other Ambulatory Visit: Payer: Self-pay

## 2019-01-06 ENCOUNTER — Ambulatory Visit (INDEPENDENT_AMBULATORY_CARE_PROVIDER_SITE_OTHER)
Admission: RE | Admit: 2019-01-06 | Discharge: 2019-01-06 | Disposition: A | Discharge status: Home / Self Care | Payer: Self-pay | Source: Ambulatory Visit | Attending: Internal Medicine | Admitting: Internal Medicine

## 2019-01-06 DIAGNOSIS — H0260 Xanthelasma of unspecified eye, unspecified eyelid: Secondary | ICD-10-CM

## 2019-01-06 DIAGNOSIS — E785 Hyperlipidemia, unspecified: Secondary | ICD-10-CM

## 2019-08-04 ENCOUNTER — Other Ambulatory Visit: Payer: Self-pay

## 2019-08-04 ENCOUNTER — Encounter: Payer: Self-pay | Admitting: Internal Medicine

## 2019-08-04 ENCOUNTER — Ambulatory Visit (INDEPENDENT_AMBULATORY_CARE_PROVIDER_SITE_OTHER): Payer: 59 | Admitting: Internal Medicine

## 2019-08-04 VITALS — BP 106/70 | HR 72 | Temp 98.2°F | Ht 72.0 in | Wt 207.0 lb

## 2019-08-04 DIAGNOSIS — H40053 Ocular hypertension, bilateral: Secondary | ICD-10-CM

## 2019-08-04 DIAGNOSIS — Z0000 Encounter for general adult medical examination without abnormal findings: Secondary | ICD-10-CM

## 2019-08-04 DIAGNOSIS — E538 Deficiency of other specified B group vitamins: Secondary | ICD-10-CM

## 2019-08-04 DIAGNOSIS — E559 Vitamin D deficiency, unspecified: Secondary | ICD-10-CM | POA: Diagnosis not present

## 2019-08-04 DIAGNOSIS — M722 Plantar fascial fibromatosis: Secondary | ICD-10-CM

## 2019-08-04 DIAGNOSIS — H0260 Xanthelasma of unspecified eye, unspecified eyelid: Secondary | ICD-10-CM

## 2019-08-04 NOTE — Assessment & Plan Note (Signed)
Vit D 

## 2019-08-04 NOTE — Assessment & Plan Note (Signed)
2020 CT Coronary calcium score of 0. This was 0 percentile for age and sex matched control. 

## 2019-08-04 NOTE — Progress Notes (Signed)
Subjective:  Patient ID: Craig Atkins, male    DOB: Jun 16, 1965  Age: 54 y.o. MRN: [id number]  CC: No chief complaint on file.   HPI Craig Atkins presents for a well exam F/u B12 def, Vit D def C/o decreased vision; h/o elevated eye pressure  Outpatient Medications Prior to Visit  Medication Sig Dispense Refill  . CVS D3 2000 units CAPS TAKE ONE CAPSULE BY MOUTH EVERY DAY 90 capsule 3  . Cyanocobalamin 2500 MCG SUBL Use as directed in the mouth or throat daily.    . fluticasone furoate-vilanterol (BREO ELLIPTA) 100-25 MCG/INH AEPB Inhale 1 puff into the lungs daily. 1 each 5  . loratadine (CLARITIN) 10 MG tablet Take 1 tablet (10 mg total) by mouth daily. 30 tablet 11  . Cholecalciferol (VITAMIN D3) 50000 units CAPS Take 1 capsule by mouth once a week. (Patient not taking: Reported on 08/04/2019) 6 capsule 0   No facility-administered medications prior to visit.    ROS: Review of Systems  Constitutional: Negative for appetite change, fatigue and unexpected weight change.  HENT: Negative for congestion, nosebleeds, sneezing, sore throat and trouble swallowing.   Eyes: Negative for itching and visual disturbance.  Respiratory: Negative for cough.   Cardiovascular: Negative for chest pain, palpitations and leg swelling.  Gastrointestinal: Negative for abdominal distention, blood in stool, diarrhea and nausea.  Genitourinary: Negative for frequency and hematuria.  Musculoskeletal: Negative for back pain, gait problem, joint swelling and neck pain.  Skin: Negative for rash.  Neurological: Negative for dizziness, tremors, speech difficulty and weakness.  Psychiatric/Behavioral: Negative for agitation, dysphoric mood and sleep disturbance. The patient is not nervous/anxious.     Objective:  BP 106/70 (BP Location: Left Arm, Patient Position: Sitting, Cuff Size: Large)   Pulse 72   Temp 98.2 F (36.8 C) (Oral)   Ht 6' (1.829 m)   Wt 207 lb (93.9 kg)   SpO2 96%   BMI 28.07 kg/m    BP Readings from Last 3 Encounters:  08/04/19 106/70  11/25/18 104/70  09/16/17 108/72    Wt Readings from Last 3 Encounters:  08/04/19 207 lb (93.9 kg)  11/25/18 205 lb (93 kg)  09/16/17 205 lb (93 kg)    Physical Exam Constitutional:      General: He is not in acute distress.    Appearance: He is well-developed.     Comments: NAD  Eyes:     Conjunctiva/sclera: Conjunctivae normal.     Pupils: Pupils are equal, round, and reactive to light.  Neck:     Thyroid: No thyromegaly.     Vascular: No JVD.  Cardiovascular:     Rate and Rhythm: Normal rate and regular rhythm.     Heart sounds: Normal heart sounds. No murmur. No friction rub. No gallop.   Pulmonary:     Effort: Pulmonary effort is normal. No respiratory distress.     Breath sounds: Normal breath sounds. No wheezing or rales.  Chest:     Chest wall: No tenderness.  Abdominal:     General: Bowel sounds are normal. There is no distension.     Palpations: Abdomen is soft. There is no mass.     Tenderness: There is no abdominal tenderness. There is no guarding or rebound.  Musculoskeletal:        General: No tenderness. Normal range of motion.     Cervical back: Normal range of motion.  Lymphadenopathy:     Cervical: No cervical adenopathy.  Skin:  General: Skin is warm and dry.     Findings: No rash.  Neurological:     Mental Status: He is alert and oriented to person, place, and time.     Cranial Nerves: No cranial nerve deficit.     Motor: No abnormal muscle tone.     Coordination: Coordination normal.     Gait: Gait normal.     Deep Tendon Reflexes: Reflexes are normal and symmetric.  Psychiatric:        Behavior: Behavior normal.        Thought Content: Thought content normal.        Judgment: Judgment normal.   Pt declined prostate exam  Lab Results  Component Value Date   WBC 6.2 09/16/2017   HGB 16.2 09/16/2017   HCT 46.2 09/16/2017   PLT 199.0 09/16/2017   GLUCOSE 86 09/16/2017   CHOL  234 (H) 09/16/2017   TRIG 210.0 (H) 09/16/2017   HDL 41.50 09/16/2017   LDLDIRECT 155.0 09/16/2017   LDLCALC 147 (H) 09/17/2016   ALT 24 09/16/2017   AST 13 09/16/2017   NA 142 09/16/2017   K 3.8 09/16/2017   CL 106 09/16/2017   CREATININE 0.89 09/16/2017   BUN 20 09/16/2017   CO2 29 09/16/2017   TSH 0.70 09/16/2017   PSA 2.69 09/16/2017    CT CARDIAC SCORING  Addendum Date: 01/07/2019   ADDENDUM REPORT: 01/07/2019 08:14 EXAM: OVER-READ INTERPRETATION  CT CHEST The following report is an over-read performed by radiologist Dr. Samara Snide [facility], [place] on 01/07/2019. This over-read does not include interpretation of cardiac or coronary anatomy or pathology. The coronary calcium score interpretation by the cardiologist is attached. COMPARISON:  None. FINDINGS: Cardiovascular: Normal heart size. No significant pericardial effusion/thickening. Great vessels are normal in course and caliber. Mediastinum/Nodes: Unremarkable esophagus. No pathologically enlarged mediastinal or hilar lymph nodes, noting limited sensitivity for the detection of hilar adenopathy on this noncontrast study. Lungs/Pleura: No pneumothorax. No pleural effusion. No acute consolidative airspace disease or lung masses. 2 tiny right pulmonary nodules, largest 2 mm in the basilar right upper lobe along the minor fissure (series 3/image 12). Upper abdomen: No acute abnormality. Musculoskeletal: No aggressive appearing focal osseous lesions. Mild thoracic spondylosis. IMPRESSION: Two tiny right pulmonary nodules, largest 2 mm. No follow-up needed if patient is low-risk (and has no known or suspected primary neoplasm). Non-contrast chest CT can be considered in 12 months if patient is high-risk. This recommendation follows the consensus statement: Guidelines for Management of Incidental Pulmonary Nodules Detected on CT Images:From the Fleischner Society 2017; published online before print (10.1148/radiol.[id number]).  Electronically Signed   By: Ilona Sorrel M.D.   On: 01/07/2019 08:14   Result Date: 01/07/2019 CLINICAL DATA:  Risk stratification EXAM: Coronary Calcium Score MEDICATIONS: None TECHNIQUE: The patient was scanned on a [employer]. Axial non-contrast 3 mm slices were carried out through the heart. The data set was analyzed on a dedicated work station and scored using the [place]. FINDINGS: Non-cardiac: See separate report from [facility]. Ascending Aorta: Normal size, no calcifications. Pericardium: Normal. Coronary arteries: Normal origin. IMPRESSION: Coronary calcium score of 0. This was 0 percentile for age and sex matched control. Electronically Signed: By: Ena Dawley On: 01/06/2019 17:23    Assessment & Plan:    Walker Kehr, MD

## 2019-08-04 NOTE — Assessment & Plan Note (Signed)
On B12 

## 2019-08-04 NOTE — Assessment & Plan Note (Signed)
Resolved

## 2019-08-09 ENCOUNTER — Encounter: Payer: Self-pay | Admitting: Internal Medicine

## 2019-08-10 ENCOUNTER — Other Ambulatory Visit: Payer: Self-pay

## 2019-08-10 ENCOUNTER — Other Ambulatory Visit (INDEPENDENT_AMBULATORY_CARE_PROVIDER_SITE_OTHER): Payer: 59

## 2019-08-10 DIAGNOSIS — Z0000 Encounter for general adult medical examination without abnormal findings: Secondary | ICD-10-CM | POA: Diagnosis not present

## 2019-08-10 LAB — URINALYSIS
Bilirubin Urine: NEGATIVE
Hgb urine dipstick: NEGATIVE
Ketones, ur: NEGATIVE
Leukocytes,Ua: NEGATIVE
Nitrite: NEGATIVE
Specific Gravity, Urine: 1.01 (ref 1.000–1.030)
Total Protein, Urine: NEGATIVE
Urine Glucose: NEGATIVE
Urobilinogen, UA: 0.2 (ref 0.0–1.0)
pH: 7.5 (ref 5.0–8.0)

## 2019-08-10 LAB — BASIC METABOLIC PANEL
BUN: 10 mg/dL (ref 6–23)
CO2: 28 mEq/L (ref 19–32)
Calcium: 9.2 mg/dL (ref 8.4–10.5)
Chloride: 105 mEq/L (ref 96–112)
Creatinine, Ser: 0.81 mg/dL (ref 0.40–1.50)
GFR: 99.74 mL/min (ref >60.00)
Glucose, Bld: 93 mg/dL (ref 70–99)
Potassium: 3.7 mEq/L (ref 3.5–5.1)
Sodium: 138 mEq/L (ref 135–145)

## 2019-08-10 LAB — LIPID PANEL
Cholesterol: 229 mg/dL — ABNORMAL HIGH (ref 0–200)
HDL: 43.5 mg/dL (ref >39.00)
LDL Cholesterol: 154 mg/dL — ABNORMAL HIGH (ref 0–99)
NonHDL: 185.89
Total CHOL/HDL Ratio: 5
Triglycerides: 159 mg/dL — ABNORMAL HIGH (ref 0.0–149.0)
VLDL: 31.8 mg/dL (ref 0.0–40.0)

## 2019-08-10 LAB — CBC WITH DIFFERENTIAL/PLATELET
Basophils Absolute: 0 10*3/uL (ref 0.0–0.1)
Basophils Relative: 0.6 % (ref 0.0–3.0)
Eosinophils Absolute: 0.1 10*3/uL (ref 0.0–0.7)
Eosinophils Relative: 1 % (ref 0.0–5.0)
HCT: 44.5 % (ref 39.0–52.0)
Hemoglobin: 15.5 g/dL (ref 13.0–17.0)
Lymphocytes Relative: 29.3 % (ref 12.0–46.0)
Lymphs Abs: 1.6 10*3/uL (ref 0.7–4.0)
MCHC: 34.9 g/dL (ref 30.0–36.0)
MCV: 89.2 fl (ref 78.0–100.0)
Monocytes Absolute: 0.3 10*3/uL (ref 0.1–1.0)
Monocytes Relative: 6.4 % (ref 3.0–12.0)
Neutro Abs: 3.4 10*3/uL (ref 1.4–7.7)
Neutrophils Relative %: 62.7 % (ref 43.0–77.0)
Platelets: 210 10*3/uL (ref 150.0–400.0)
RBC: 4.98 Mil/uL (ref 4.22–5.81)
RDW: 13.5 % (ref 11.5–15.5)
WBC: 5.5 10*3/uL (ref 4.0–10.5)

## 2019-08-10 LAB — VITAMIN D 25 HYDROXY (VIT D DEFICIENCY, FRACTURES): VITD: 28.77 ng/mL — ABNORMAL LOW (ref 30.00–100.00)

## 2019-08-10 LAB — VITAMIN B12: Vitamin B-12: 1144 pg/mL — ABNORMAL HIGH (ref 211–911)

## 2019-08-10 LAB — HEPATIC FUNCTION PANEL
ALT: 28 U/L (ref 0–53)
AST: 18 U/L (ref 0–37)
Albumin: 4.5 g/dL (ref 3.5–5.2)
Alkaline Phosphatase: 62 U/L (ref 39–117)
Bilirubin, Direct: 0.1 mg/dL (ref 0.0–0.3)
Total Bilirubin: 0.7 mg/dL (ref 0.2–1.2)
Total Protein: 6.8 g/dL (ref 6.0–8.3)

## 2019-08-10 LAB — PSA: PSA: 2.61 ng/mL (ref 0.10–4.00)

## 2019-08-10 LAB — TSH: TSH: 0.51 u[IU]/mL (ref 0.35–4.50)

## 2019-08-12 ENCOUNTER — Other Ambulatory Visit: Payer: Self-pay | Admitting: Internal Medicine

## 2019-08-12 MED ORDER — VITAMIN D3 1.25 MG (50000 UT) PO CAPS: 1.0000 | ORAL_CAPSULE | ORAL | 0 refills | Status: DC

## 2019-10-02 ENCOUNTER — Other Ambulatory Visit: Payer: Self-pay | Admitting: Internal Medicine

## 2020-02-10 ENCOUNTER — Ambulatory Visit (INDEPENDENT_AMBULATORY_CARE_PROVIDER_SITE_OTHER): Payer: 59 | Admitting: Internal Medicine

## 2020-02-10 ENCOUNTER — Other Ambulatory Visit: Payer: Self-pay

## 2020-02-10 ENCOUNTER — Encounter: Payer: Self-pay | Admitting: Internal Medicine

## 2020-02-10 DIAGNOSIS — R300 Dysuria: Secondary | ICD-10-CM | POA: Insufficient documentation

## 2020-02-10 DIAGNOSIS — N411 Chronic prostatitis: Secondary | ICD-10-CM | POA: Insufficient documentation

## 2020-02-10 DIAGNOSIS — E538 Deficiency of other specified B group vitamins: Secondary | ICD-10-CM | POA: Diagnosis not present

## 2020-02-10 LAB — CBC WITH DIFFERENTIAL/PLATELET
Basophils Absolute: 0 10*3/uL (ref 0.0–0.1)
Basophils Relative: 0.6 % (ref 0.0–3.0)
Eosinophils Absolute: 0.1 10*3/uL (ref 0.0–0.7)
Eosinophils Relative: 0.9 % (ref 0.0–5.0)
HCT: 45.5 % (ref 39.0–52.0)
Hemoglobin: 15.7 g/dL (ref 13.0–17.0)
Lymphocytes Relative: 31 % (ref 12.0–46.0)
Lymphs Abs: 2 10*3/uL (ref 0.7–4.0)
MCHC: 34.5 g/dL (ref 30.0–36.0)
MCV: 88.5 fl (ref 78.0–100.0)
Monocytes Absolute: 0.5 10*3/uL (ref 0.1–1.0)
Monocytes Relative: 7.5 % (ref 3.0–12.0)
Neutro Abs: 3.9 10*3/uL (ref 1.4–7.7)
Neutrophils Relative %: 60 % (ref 43.0–77.0)
Platelets: 219 10*3/uL (ref 150.0–400.0)
RBC: 5.14 Mil/uL (ref 4.22–5.81)
RDW: 13.7 % (ref 11.5–15.5)
WBC: 6.4 10*3/uL (ref 4.0–10.5)

## 2020-02-10 LAB — COMPREHENSIVE METABOLIC PANEL
ALT: 20 U/L (ref 0–53)
AST: 17 U/L (ref 0–37)
Albumin: 4.5 g/dL (ref 3.5–5.2)
Alkaline Phosphatase: 53 U/L (ref 39–117)
BUN: 14 mg/dL (ref 6–23)
CO2: 31 mEq/L (ref 19–32)
Calcium: 9 mg/dL (ref 8.4–10.5)
Chloride: 103 mEq/L (ref 96–112)
Creatinine, Ser: 0.97 mg/dL (ref 0.40–1.50)
GFR: 89.22 mL/min (ref >60.00)
Glucose, Bld: 79 mg/dL (ref 70–99)
Potassium: 3.6 mEq/L (ref 3.5–5.1)
Sodium: 140 mEq/L (ref 135–145)
Total Bilirubin: 0.5 mg/dL (ref 0.2–1.2)
Total Protein: 7.2 g/dL (ref 6.0–8.3)

## 2020-02-10 LAB — URINALYSIS
Bilirubin Urine: NEGATIVE
Hgb urine dipstick: NEGATIVE
Ketones, ur: NEGATIVE
Leukocytes,Ua: NEGATIVE
Nitrite: NEGATIVE
Specific Gravity, Urine: 1.015 (ref 1.000–1.030)
Total Protein, Urine: NEGATIVE
Urine Glucose: NEGATIVE
Urobilinogen, UA: 0.2 (ref 0.0–1.0)
pH: 7 (ref 5.0–8.0)

## 2020-02-10 MED ORDER — DOXYCYCLINE HYCLATE 100 MG PO TABS: 100.0000 mg | ORAL_TABLET | Freq: Two times a day (BID) | ORAL | 0 refills | Status: DC

## 2020-02-10 MED ORDER — PHENAZOPYRIDINE HCL 95 MG PO TABS: 95.0000 mg | ORAL_TABLET | Freq: Three times a day (TID) | ORAL | 0 refills | Status: DC | PRN

## 2020-02-10 NOTE — Assessment & Plan Note (Signed)
UA and tests Labs Doxy  Pyridium prn [place]

## 2020-02-10 NOTE — Progress Notes (Signed)
Subjective:  Patient ID: Craig Atkins, male    DOB: [birth year]  Age: 54 y.o. MRN: [id number]  CC: Flank Pain (Pt states he also been having some urgency off and on)   HPI Craig Atkins presents for LBP and dysuria 3 episodes in 1 year x 1.5 days long. Last episode - 2 wks ago. Urine ok. H/o prostatitis before 2004    Outpatient Medications Prior to Visit  Medication Sig Dispense Refill  . CVS D3 2000 units CAPS TAKE ONE CAPSULE BY MOUTH EVERY DAY 90 capsule 3  . Cyanocobalamin 2500 MCG SUBL Use as directed in the mouth or throat daily.    . Cholecalciferol (VITAMIN D3) 1.25 MG (50000 UT) CAPS Take 1 capsule by mouth once a week. (Patient not taking: Reported on 02/10/2020) 8 capsule 0  . loratadine (CLARITIN) 10 MG tablet Take 1 tablet (10 mg total) by mouth daily. (Patient not taking: Reported on 02/10/2020) 30 tablet 11   No facility-administered medications prior to visit.    ROS: Review of Systems  Constitutional: Negative for appetite change, chills, fatigue, fever and unexpected weight change.  HENT: Negative for congestion, nosebleeds, sneezing, sore throat and trouble swallowing.   Eyes: Negative for itching and visual disturbance.  Respiratory: Negative for cough.   Cardiovascular: Negative for chest pain, palpitations and leg swelling.  Gastrointestinal: Negative for abdominal distention, blood in stool, diarrhea and nausea.  Genitourinary: Positive for dysuria, frequency and urgency. Negative for hematuria.  Musculoskeletal: Negative for back pain, gait problem, joint swelling and neck pain.  Skin: Negative for rash.  Neurological: Negative for dizziness, tremors, speech difficulty and weakness.  Psychiatric/Behavioral: Negative for agitation, dysphoric mood and sleep disturbance. The patient is not nervous/anxious.     Objective:  BP 110/70 (BP Location: Left Arm)   Pulse 74   Temp 98.7 F (37.1 C) (Oral)   Wt 209 lb 12.8 oz (95.2 kg)   SpO2 96%   BMI 28.45  kg/m   BP Readings from Last 3 Encounters:  02/10/20 110/70  08/04/19 106/70  11/25/18 104/70    Wt Readings from Last 3 Encounters:  02/10/20 209 lb 12.8 oz (95.2 kg)  08/04/19 207 lb (93.9 kg)  11/25/18 205 lb (93 kg)    Physical Exam Constitutional:      General: He is not in acute distress.    Appearance: He is well-developed.     Comments: NAD  Eyes:     Conjunctiva/sclera: Conjunctivae normal.     Pupils: Pupils are equal, round, and reactive to light.  Neck:     Thyroid: No thyromegaly.     Vascular: No JVD.  Cardiovascular:     Rate and Rhythm: Normal rate and regular rhythm.     Heart sounds: Normal heart sounds. No murmur heard.  No friction rub. No gallop.   Pulmonary:     Effort: Pulmonary effort is normal. No respiratory distress.     Breath sounds: Normal breath sounds. No wheezing or rales.  Chest:     Chest wall: No tenderness.  Abdominal:     General: Bowel sounds are normal. There is no distension.     Palpations: Abdomen is soft. There is no mass.     Tenderness: There is no abdominal tenderness. There is no guarding or rebound.  Musculoskeletal:        General: No tenderness. Normal range of motion.     Cervical back: Normal range of motion.  Lymphadenopathy:     Cervical: No  cervical adenopathy.  Skin:    General: Skin is warm and dry.     Findings: No rash.  Neurological:     Mental Status: He is alert and oriented to person, place, and time.     Cranial Nerves: No cranial nerve deficit.     Motor: No abnormal muscle tone.     Coordination: Coordination normal.     Gait: Gait normal.     Deep Tendon Reflexes: Reflexes are normal and symmetric.  Psychiatric:        Behavior: Behavior normal.        Thought Content: Thought content normal.        Judgment: Judgment normal.    Genital/rectal exam declined Lab Results  Component Value Date   WBC 5.5 08/10/2019   HGB 15.5 08/10/2019   HCT 44.5 08/10/2019   PLT 210.0 08/10/2019    GLUCOSE 93 08/10/2019   CHOL 229 (H) 08/10/2019   TRIG 159.0 (H) 08/10/2019   HDL 43.50 08/10/2019   LDLDIRECT 155.0 09/16/2017   LDLCALC 154 (H) 08/10/2019   ALT 28 08/10/2019   AST 18 08/10/2019   NA 138 08/10/2019   K 3.7 08/10/2019   CL 105 08/10/2019   CREATININE 0.81 08/10/2019   BUN 10 08/10/2019   CO2 28 08/10/2019   TSH 0.51 08/10/2019   PSA 2.61 08/10/2019    CT CARDIAC SCORING  Addendum Date: 01/07/2019   ADDENDUM REPORT: 01/07/2019 08:14 EXAM: OVER-READ INTERPRETATION  CT CHEST The following report is an over-read performed by radiologist Dr. Samara Snide [facility], [place] on 01/07/2019. This over-read does not include interpretation of cardiac or coronary anatomy or pathology. The coronary calcium score interpretation by the cardiologist is attached. COMPARISON:  None. FINDINGS: Cardiovascular: Normal heart size. No significant pericardial effusion/thickening. Great vessels are normal in course and caliber. Mediastinum/Nodes: Unremarkable esophagus. No pathologically enlarged mediastinal or hilar lymph nodes, noting limited sensitivity for the detection of hilar adenopathy on this noncontrast study. Lungs/Pleura: No pneumothorax. No pleural effusion. No acute consolidative airspace disease or lung masses. 2 tiny right pulmonary nodules, largest 2 mm in the basilar right upper lobe along the minor fissure (series 3/image 12). Upper abdomen: No acute abnormality. Musculoskeletal: No aggressive appearing focal osseous lesions. Mild thoracic spondylosis. IMPRESSION: Two tiny right pulmonary nodules, largest 2 mm. No follow-up needed if patient is low-risk (and has no known or suspected primary neoplasm). Non-contrast chest CT can be considered in 12 months if patient is high-risk. This recommendation follows the consensus statement: Guidelines for Management of Incidental Pulmonary Nodules Detected on CT Images:From the Fleischner Society 2017; published online before print  (10.1148/[id number]). Electronically Signed   By: Ilona Sorrel M.D.   On: 01/07/2019 08:14   Result Date: 01/07/2019 CLINICAL DATA:  Risk stratification EXAM: Coronary Calcium Score MEDICATIONS: None TECHNIQUE: The patient was scanned on a [employer]. Axial non-contrast 3 mm slices were carried out through the heart. The data set was analyzed on a dedicated work station and scored using the [place]. FINDINGS: Non-cardiac: See separate report from [facility]. Ascending Aorta: Normal size, no calcifications. Pericardium: Normal. Coronary arteries: Normal origin. IMPRESSION: Coronary calcium score of 0. This was 0 percentile for age and sex matched control. Electronically Signed: By: Ena Dawley On: 01/06/2019 17:23    Assessment & Plan:    Walker Kehr, MD

## 2020-02-10 NOTE — Assessment & Plan Note (Addendum)
Labs [place] Doxy if needed Prescribed Pyridium as needed

## 2020-02-11 LAB — HIV ANTIBODY (ROUTINE TESTING W REFLEX): HIV 1&2 Ab, 4th Generation: NONREACTIVE

## 2020-02-12 LAB — CULTURE, URINE COMPREHENSIVE: RESULT:: NO GROWTH

## 2020-02-13 NOTE — Assessment & Plan Note (Signed)
On B12 

## 2020-02-14 LAB — GC/CHLAMYDIA PROBE AMP
Chlamydia trachomatis, NAA: NEGATIVE
Neisseria Gonorrhoeae by PCR: NEGATIVE

## 2020-07-26 IMAGING — CT CT HEART SCORING
2 series · 16 of 20 positions shown, 18 images · non-contrast
Comparison: None.

Addendum:
CLINICAL DATA: Risk stratification

EXAM:
Coronary Calcium Score
MEDICATIONS:
None
TECHNIQUE: The patient was scanned on a Siemens Force scanner. Axial
non-contrast 3 mm slices were carried out through the heart. The
data set was analyzed on a dedicated work station and scored using
the [place] method.

[Series 2: casc 3.0 i36f 2 bestdiast 69 % · axial · 0.39mm/px · z∈[-232,-128]mm · 8 of 45 slices shown, 10 images]
[im 5/45  vessel]
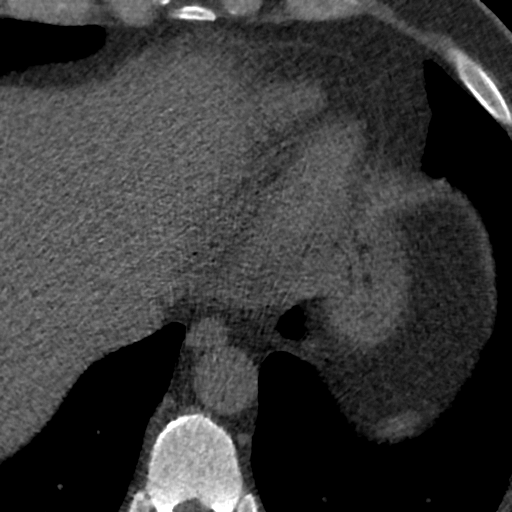
[im 5/45  lung]
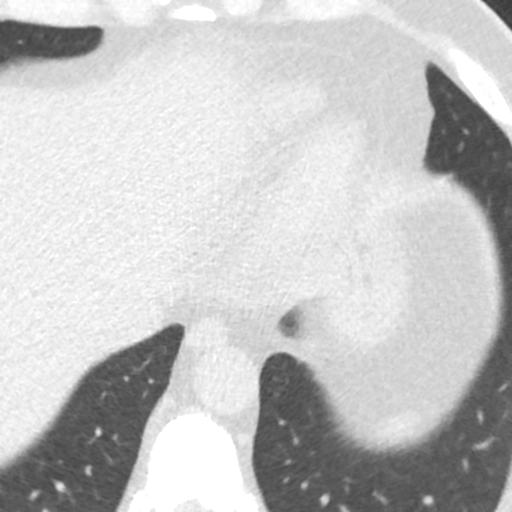
[im 10/45  vessel]
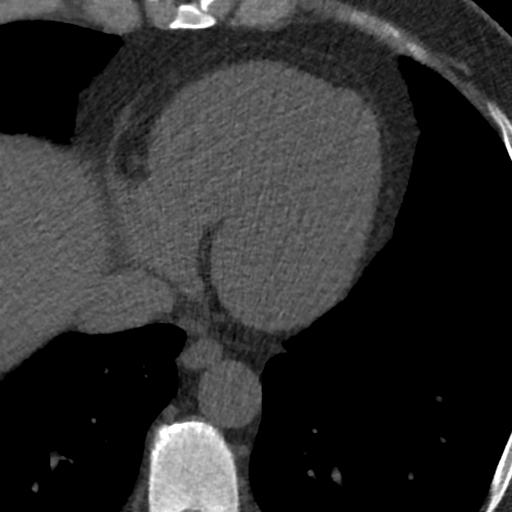
[im 15/45  vessel]
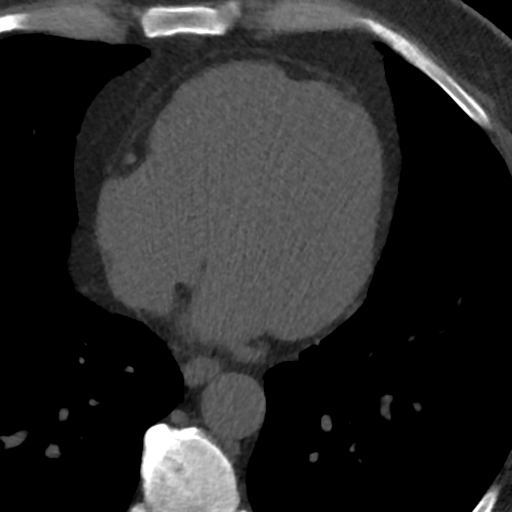
[im 20/45  vessel]
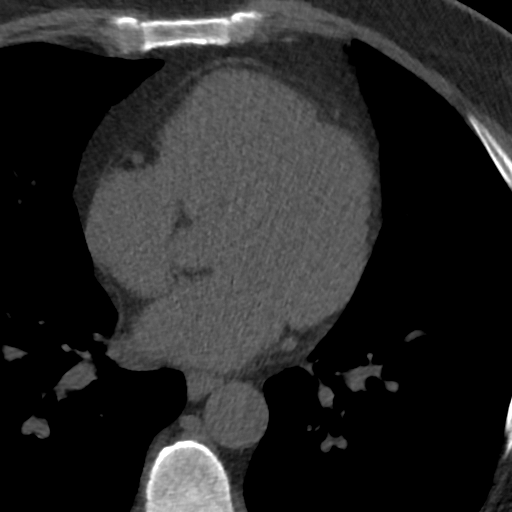
[im 25/45  vessel]
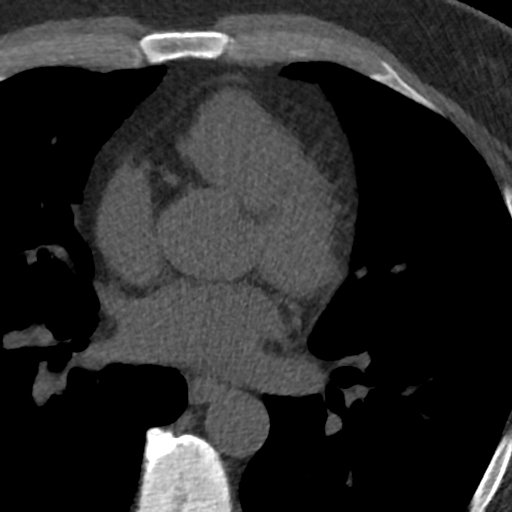
[im 25/45  lung]
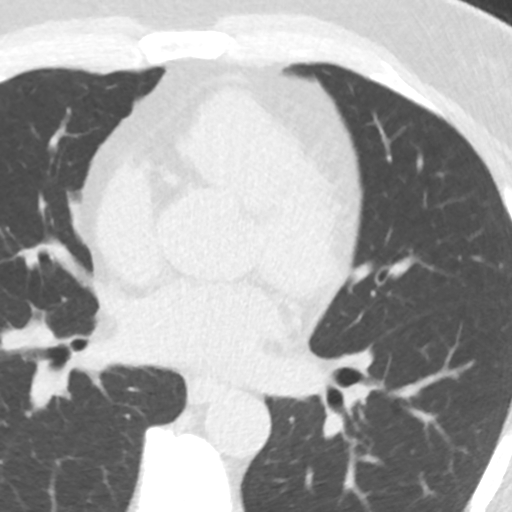
[im 30/45  vessel]
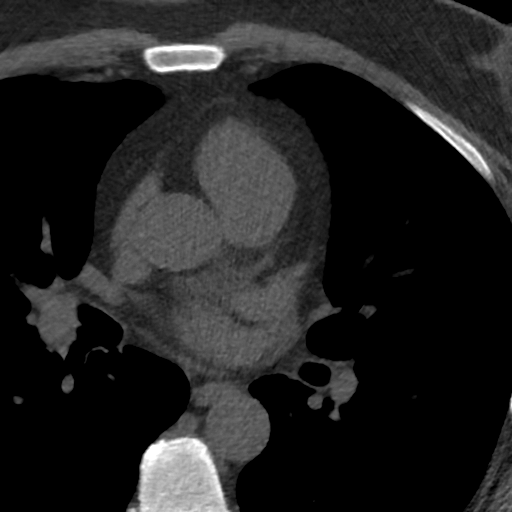
[im 35/45  vessel]
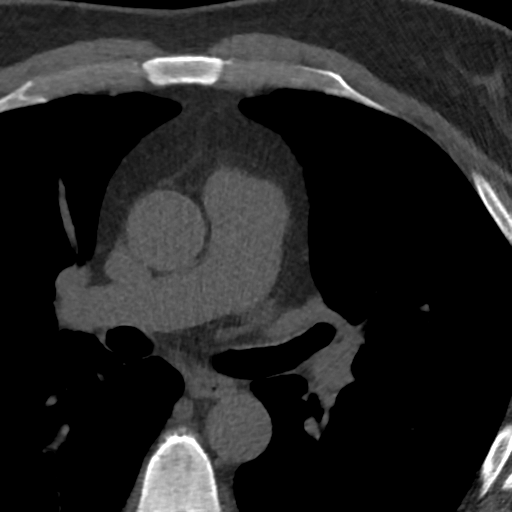
[im 40/45  vessel]
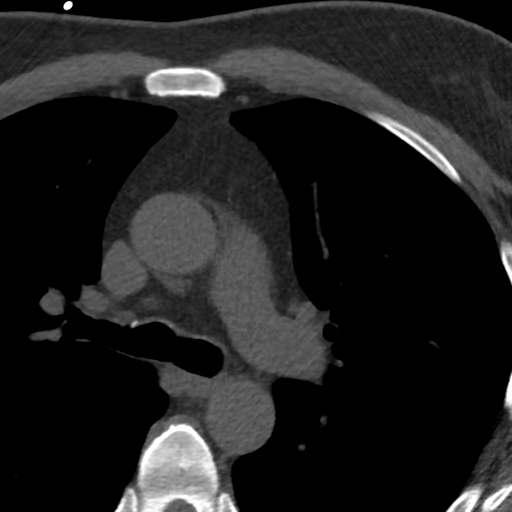

[Series 4: lung st 70 % · axial · 0.71mm/px · z∈[-232,-128]mm · 8 of 45 slices shown]
[im 5/45  lung]
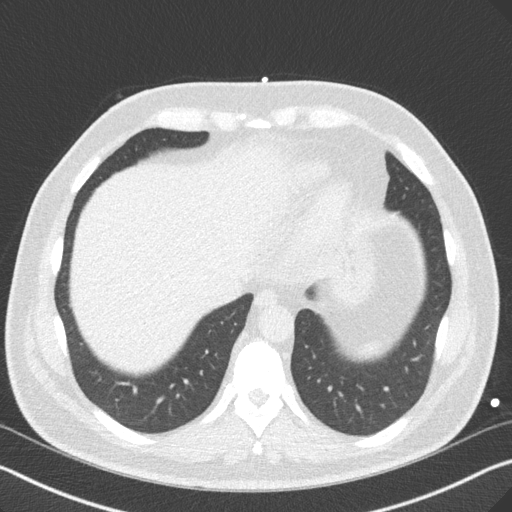
[im 10/45  lung]
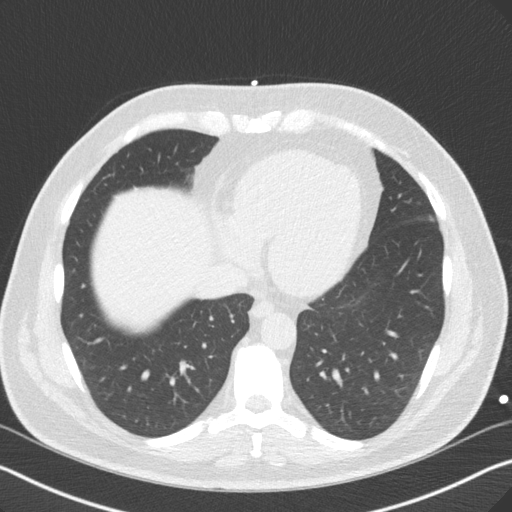
[im 15/45  lung]
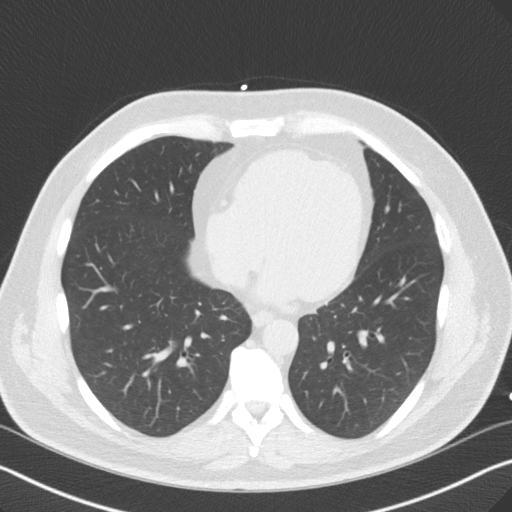
[im 20/45  lung]
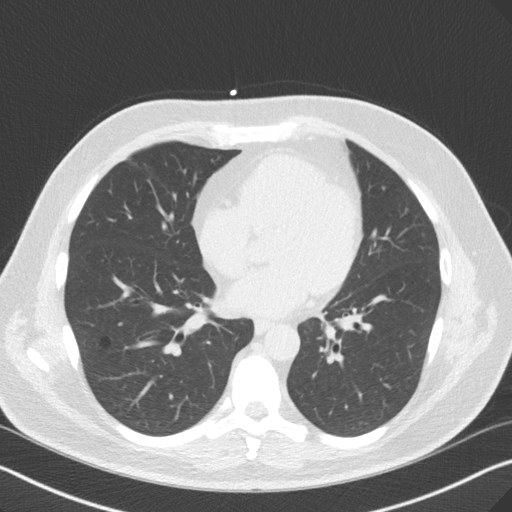
[im 25/45  lung]
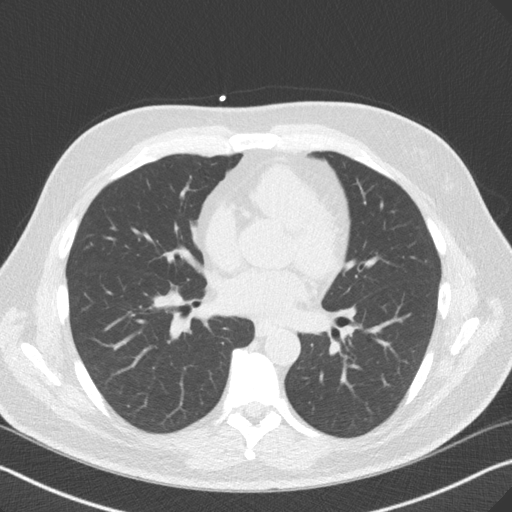
[im 30/45  lung]
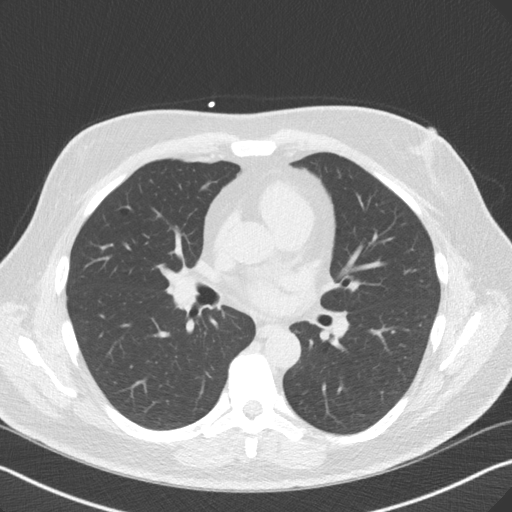
[im 35/45  lung]
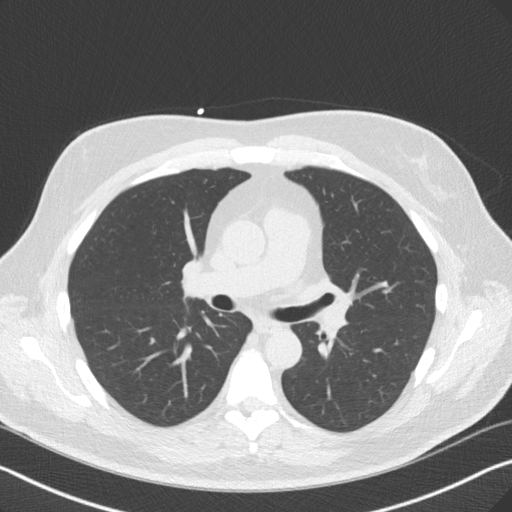
[im 40/45  lung]
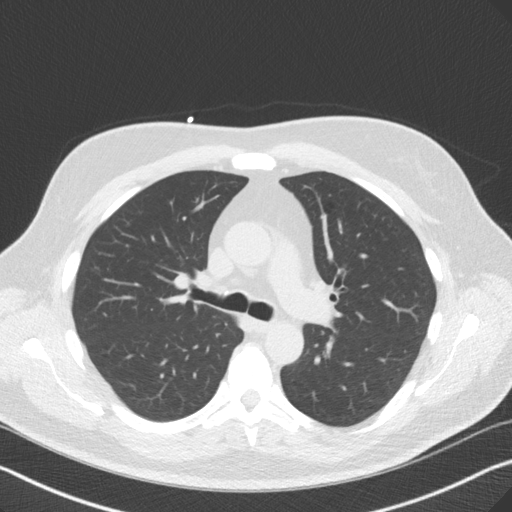

[16 of 20 positions shown; findings below may reference images not displayed]

FINDINGS: Non-cardiac: See separate report from [REDACTED].

Ascending Aorta: Normal size, no calcifications.

Pericardium: Normal.

Coronary arteries: Normal origin.
IMPRESSION: Coronary calcium score of 0. This was 0 percentile for age and sex
matched control.

EXAM:
OVER-READ INTERPRETATION  CT CHEST

The following report is an over-read performed by radiologist Dr.
does not include interpretation of cardiac or coronary anatomy or
pathology. The coronary calcium score interpretation by the
cardiologist is attached.
FINDINGS: Cardiovascular: Normal heart size. No significant pericardial
effusion/thickening. Great vessels are normal in course and caliber.

Mediastinum/Nodes: Unremarkable esophagus. No pathologically
enlarged mediastinal or hilar lymph nodes, noting limited
sensitivity for the detection of hilar adenopathy on this
noncontrast study.

Lungs/Pleura: No pneumothorax. No pleural effusion. No acute
consolidative airspace disease or lung masses. 2 tiny right
pulmonary nodules, largest 2 mm in the basilar right upper lobe
along the minor fissure (series 3/image 12).

Upper abdomen: No acute abnormality.

Musculoskeletal: No aggressive appearing focal osseous lesions. Mild
thoracic spondylosis.
IMPRESSION: Two tiny right pulmonary nodules, largest 2 mm. No follow-up needed
if patient is low-risk (and has no known or suspected primary
neoplasm). Non-contrast chest CT can be considered in 12 months if
patient is high-risk. This recommendation follows the consensus
statement: Guidelines for Management of Incidental Pulmonary Nodules
Detected on CT Images:From the [HOSPITAL] 2547; published
online before print (10.1148/[id number]).

*** End of Addendum ***
FINDINGS: Non-cardiac: See separate report from [REDACTED].

Ascending Aorta: Normal size, no calcifications.

Pericardium: Normal.

Coronary arteries: Normal origin.
IMPRESSION: Coronary calcium score of 0. This was 0 percentile for age and sex
matched control.

## 2021-08-07 ENCOUNTER — Ambulatory Visit (INDEPENDENT_AMBULATORY_CARE_PROVIDER_SITE_OTHER): Payer: 59 | Admitting: Internal Medicine

## 2021-08-07 ENCOUNTER — Encounter: Payer: Self-pay | Admitting: Internal Medicine

## 2021-08-07 VITALS — BP 110/80 | HR 66 | Temp 98.3°F | Ht 72.0 in | Wt 207.0 lb

## 2021-08-07 DIAGNOSIS — R1011 Right upper quadrant pain: Secondary | ICD-10-CM

## 2021-08-07 DIAGNOSIS — H538 Other visual disturbances: Secondary | ICD-10-CM | POA: Diagnosis not present

## 2021-08-07 DIAGNOSIS — Z1211 Encounter for screening for malignant neoplasm of colon: Secondary | ICD-10-CM

## 2021-08-07 DIAGNOSIS — Z0000 Encounter for general adult medical examination without abnormal findings: Secondary | ICD-10-CM | POA: Diagnosis not present

## 2021-08-07 DIAGNOSIS — E559 Vitamin D deficiency, unspecified: Secondary | ICD-10-CM

## 2021-08-07 DIAGNOSIS — R5383 Other fatigue: Secondary | ICD-10-CM

## 2021-08-07 DIAGNOSIS — E538 Deficiency of other specified B group vitamins: Secondary | ICD-10-CM

## 2021-08-07 DIAGNOSIS — Z136 Encounter for screening for cardiovascular disorders: Secondary | ICD-10-CM

## 2021-08-07 LAB — COMPREHENSIVE METABOLIC PANEL
ALT: 17 U/L (ref 0–53)
AST: 15 U/L (ref 0–37)
Albumin: 4.6 g/dL (ref 3.5–5.2)
Alkaline Phosphatase: 61 U/L (ref 39–117)
BUN: 15 mg/dL (ref 6–23)
CO2: 28 mEq/L (ref 19–32)
Calcium: 9.5 mg/dL (ref 8.4–10.5)
Chloride: 104 mEq/L (ref 96–112)
Creatinine, Ser: 1.08 mg/dL (ref 0.40–1.50)
GFR: 77.61 mL/min (ref >60.00)
Glucose, Bld: 81 mg/dL (ref 70–99)
Potassium: 4 mEq/L (ref 3.5–5.1)
Sodium: 139 mEq/L (ref 135–145)
Total Bilirubin: 0.5 mg/dL (ref 0.2–1.2)
Total Protein: 7.1 g/dL (ref 6.0–8.3)

## 2021-08-07 LAB — URINALYSIS
Bilirubin Urine: NEGATIVE
Hgb urine dipstick: NEGATIVE
Ketones, ur: NEGATIVE
Leukocytes,Ua: NEGATIVE
Nitrite: NEGATIVE
Specific Gravity, Urine: 1.015 (ref 1.000–1.030)
Total Protein, Urine: NEGATIVE
Urine Glucose: NEGATIVE
Urobilinogen, UA: 0.2 (ref 0.0–1.0)
pH: 7.5 (ref 5.0–8.0)

## 2021-08-07 LAB — LIPID PANEL
Cholesterol: 230 mg/dL — ABNORMAL HIGH (ref 0–200)
HDL: 42.7 mg/dL (ref >39.00)
LDL Cholesterol: 158 mg/dL — ABNORMAL HIGH (ref 0–99)
NonHDL: 187.41
Total CHOL/HDL Ratio: 5
Triglycerides: 148 mg/dL (ref 0.0–149.0)
VLDL: 29.6 mg/dL (ref 0.0–40.0)

## 2021-08-07 LAB — CBC WITH DIFFERENTIAL/PLATELET
Basophils Absolute: 0 10*3/uL (ref 0.0–0.1)
Basophils Relative: 0.6 % (ref 0.0–3.0)
Eosinophils Absolute: 0.1 10*3/uL (ref 0.0–0.7)
Eosinophils Relative: 0.9 % (ref 0.0–5.0)
HCT: 45.7 % (ref 39.0–52.0)
Hemoglobin: 15.7 g/dL (ref 13.0–17.0)
Lymphocytes Relative: 31.6 % (ref 12.0–46.0)
Lymphs Abs: 2 10*3/uL (ref 0.7–4.0)
MCHC: 34.3 g/dL (ref 30.0–36.0)
MCV: 90.9 fl (ref 78.0–100.0)
Monocytes Absolute: 0.5 10*3/uL (ref 0.1–1.0)
Monocytes Relative: 7.7 % (ref 3.0–12.0)
Neutro Abs: 3.8 10*3/uL (ref 1.4–7.7)
Neutrophils Relative %: 59.2 % (ref 43.0–77.0)
Platelets: 211 10*3/uL (ref 150.0–400.0)
RBC: 5.02 Mil/uL (ref 4.22–5.81)
RDW: 13.5 % (ref 11.5–15.5)
WBC: 6.5 10*3/uL (ref 4.0–10.5)

## 2021-08-07 LAB — VITAMIN B12: Vitamin B-12: 595 pg/mL (ref 211–911)

## 2021-08-07 LAB — PSA: PSA: 2.48 ng/mL (ref 0.10–4.00)

## 2021-08-07 LAB — VITAMIN D 25 HYDROXY (VIT D DEFICIENCY, FRACTURES): VITD: 25.59 ng/mL — ABNORMAL LOW (ref 30.00–100.00)

## 2021-08-07 LAB — TSH: TSH: 0.49 u[IU]/mL (ref 0.35–5.50)

## 2021-08-07 LAB — TESTOSTERONE: Testosterone: 339.34 ng/dL (ref 300.00–890.00)

## 2021-08-07 NOTE — Patient Instructions (Signed)
?  Use Pepcid AC as needed for stomach upset ?

## 2021-08-07 NOTE — Assessment & Plan Note (Signed)
?  We discussed age appropriate health related issues, including available/recomended screening tests and vaccinations. Labs were ordered to be later reviewed . All questions were answered. We discussed one or more of the following - seat belt use, use of sunscreen/sun exposure exercise, fall risk reduction, second hand smoke exposure, firearm use and storage, seat belt use, a need for adhering to healthy diet and exercise. ?Labs were ordered.  All questions were answered. ?Re-start B12 ?

## 2021-08-07 NOTE — Assessment & Plan Note (Signed)
Blurred vision episode x 15-20 min on 07/27/21 after a massage session, no HA. ?Ophth migraine vs other ? ?Re-start B12 ?Check labs ?Start baby ASA ?Ophth appt ?

## 2021-08-07 NOTE — Assessment & Plan Note (Deleted)
Labs - r/o chole ?Abd [place] ?

## 2021-08-07 NOTE — Progress Notes (Signed)
? ?Subjective:  ?Patient ID: Craig Atkins, male    DOB: Jan 15, 1966  Age: 56 y.o. MRN: [id number] ? ?CC: No chief complaint on file. ? ? ?HPI ?Craig Atkins presents for a well exam ?C/o RUQ abd pain - mild, no pain now ? C/o occ HA's. ?Blurred vision episode x 15-20 min on 07/27/21 after a massage session, no HA. ? ? ?Outpatient Medications Prior to Visit  ?Medication Sig Dispense Refill  ? CVS D3 2000 units CAPS TAKE ONE CAPSULE BY MOUTH EVERY DAY 90 capsule 3  ? Cyanocobalamin 2500 MCG SUBL Use as directed in the mouth or throat daily.    ? doxycycline (VIBRA-TABS) 100 MG tablet Take 1 tablet (100 mg total) by mouth 2 (two) times daily. 60 tablet 0  ? phenazopyridine (PYRIDIUM) 95 MG tablet Take 1 tablet (95 mg total) by mouth 3 (three) times daily as needed for pain (urinary urgency, pain). 21 tablet 0  ? ?No facility-administered medications prior to visit.  ? ? ?ROS: ?Review of Systems  ?Constitutional:  Negative for appetite change, fatigue and unexpected weight change.  ?HENT:  Negative for congestion, nosebleeds, sneezing, sore throat and trouble swallowing.   ?Eyes:  Positive for visual disturbance. Negative for itching.  ?Respiratory:  Negative for cough.   ?Cardiovascular:  Negative for chest pain, palpitations and leg swelling.  ?Gastrointestinal:  Negative for abdominal distention, blood in stool, diarrhea and nausea.  ?Genitourinary:  Negative for frequency and hematuria.  ?Musculoskeletal:  Negative for back pain, gait problem, joint swelling and neck pain.  ?Skin:  Negative for rash.  ?Neurological:  Negative for dizziness, tremors, speech difficulty and weakness.  ?Psychiatric/Behavioral:  Negative for agitation, dysphoric mood and sleep disturbance. The patient is not nervous/anxious.   ? ?Objective:  ?BP 110/80 (BP Location: Left Arm, Patient Position: Sitting, Cuff Size: Normal)   Pulse 66   Temp 98.3 ?F (36.8 ?C) (Oral)   Ht 6' (1.829 m)   Wt 207 lb (93.9 kg)   SpO2 97%   BMI 28.07 kg/m?   ? ?BP Readings from Last 3 Encounters:  ?08/07/21 110/80  ?02/10/20 110/70  ?08/04/19 106/70  ? ? ?Wt Readings from Last 3 Encounters:  ?08/07/21 207 lb (93.9 kg)  ?02/10/20 209 lb 12.8 oz (95.2 kg)  ?08/04/19 207 lb (93.9 kg)  ? ? ?Physical Exam ?Constitutional:   ?   General: He is not in acute distress. ?   Appearance: Normal appearance. He is well-developed. He is not diaphoretic.  ?   Comments: NAD  ?HENT:  ?   Head: Normocephalic and atraumatic.  ?   Right Ear: External ear normal.  ?   Left Ear: External ear normal.  ?   Nose: Nose normal.  ?   Mouth/Throat:  ?   Pharynx: No oropharyngeal exudate.  ?Eyes:  ?   General: No scleral icterus.    ?   Right eye: No discharge.     ?   Left eye: No discharge.  ?   Conjunctiva/sclera: Conjunctivae normal.  ?   Pupils: Pupils are equal, round, and reactive to light.  ?Neck:  ?   Thyroid: No thyromegaly.  ?   Vascular: No JVD.  ?   Trachea: No tracheal deviation.  ?Cardiovascular:  ?   Rate and Rhythm: Normal rate and regular rhythm.  ?   Heart sounds: Normal heart sounds. No murmur heard. ?  No friction rub. No gallop.  ?Pulmonary:  ?   Effort: Pulmonary effort is normal. No  respiratory distress.  ?   Breath sounds: Normal breath sounds. No stridor. No wheezing or rales.  ?Chest:  ?   Chest wall: No tenderness.  ?Abdominal:  ?   General: Bowel sounds are normal. There is no distension.  ?   Palpations: Abdomen is soft. There is no mass.  ?   Tenderness: There is no abdominal tenderness. There is no guarding or rebound.  ?Genitourinary: ?   Penis: Normal. No tenderness.   ?   Prostate: Normal.  ?   Rectum: Normal. Guaiac result negative.  ?Musculoskeletal:     ?   General: No tenderness. Normal range of motion.  ?   Cervical back: Normal range of motion and neck supple.  ?Lymphadenopathy:  ?   Cervical: No cervical adenopathy.  ?Skin: ?   General: Skin is warm and dry.  ?   Coloration: Skin is not pale.  ?   Findings: No erythema or rash.  ?Neurological:  ?   Mental  Status: He is alert and oriented to person, place, and time.  ?   Cranial Nerves: No cranial nerve deficit.  ?   Motor: No abnormal muscle tone.  ?   Coordination: Coordination normal.  ?   Gait: Gait normal.  ?   Deep Tendon Reflexes: Reflexes are normal and symmetric. Reflexes normal.  ?Psychiatric:     ?   Behavior: Behavior normal.     ?   Thought Content: Thought content normal.     ?   Judgment: Judgment normal.  ? ?I spent 22 minutes in addition to time for CPX wellness examination in preparing to see the patient by review of recent labs, imaging and procedures, obtaining and reviewing separately obtained history, communicating with the patient, ordering medications, tests or procedures, and documenting clinical information in the EHR including the differential diagnosis, treatment, and any further evaluation and other management of blurred vision episode, right upper quadrant abdominal pain that has resolved, vitamin B12 and vitamin D deficiency.  ?  ?  ? ? ?Lab Results  ?Component Value Date  ? WBC 6.5 08/07/2021  ? HGB 15.7 08/07/2021  ? HCT 45.7 08/07/2021  ? PLT 211.0 08/07/2021  ? GLUCOSE 81 08/07/2021  ? CHOL 230 (H) 08/07/2021  ? TRIG 148.0 08/07/2021  ? HDL 42.70 08/07/2021  ? LDLDIRECT 155.0 09/16/2017  ? LDLCALC 158 (H) 08/07/2021  ? ALT 17 08/07/2021  ? AST 15 08/07/2021  ? NA 139 08/07/2021  ? K 4.0 08/07/2021  ? CL 104 08/07/2021  ? CREATININE 1.08 08/07/2021  ? BUN 15 08/07/2021  ? CO2 28 08/07/2021  ? TSH 0.49 08/07/2021  ? PSA 2.48 08/07/2021  ? ? ?CT CARDIAC SCORING ? ?Addendum Date: 01/07/2019   ?ADDENDUM REPORT: 01/07/2019 08:14 EXAM: OVER-READ INTERPRETATION  CT CHEST The following report is an over-read performed by radiologist Dr. Samara Snide [facility], [place] on 01/07/2019. This over-read does not include interpretation of cardiac or coronary anatomy or pathology. The coronary calcium score interpretation by the cardiologist is attached. COMPARISON:  None. FINDINGS:  Cardiovascular: Normal heart size. No significant pericardial effusion/thickening. Great vessels are normal in course and caliber. Mediastinum/Nodes: Unremarkable esophagus. No pathologically enlarged mediastinal or hilar lymph nodes, noting limited sensitivity for the detection of hilar adenopathy on this noncontrast study. Lungs/Pleura: No pneumothorax. No pleural effusion. No acute consolidative airspace disease or lung masses. 2 tiny right pulmonary nodules, largest 2 mm in the basilar right upper lobe along the minor fissure (series 3/image 12).  Upper abdomen: No acute abnormality. Musculoskeletal: No aggressive appearing focal osseous lesions. Mild thoracic spondylosis. IMPRESSION: Two tiny right pulmonary nodules, largest 2 mm. No follow-up needed if patient is low-risk (and has no known or suspected primary neoplasm). Non-contrast chest CT can be considered in 12 months if patient is high-risk. This recommendation follows the consensus statement: Guidelines for Management of Incidental Pulmonary Nodules Detected on CT Images:From the Fleischner Society 2017; published online before print (10.1148/radiol.4034742595). Electronically Signed   By: Ilona Sorrel M.D.   On: 01/07/2019 08:14  ? ?Result Date: 01/07/2019 ?CLINICAL DATA:  Risk stratification EXAM: Coronary Calcium Score MEDICATIONS: None TECHNIQUE: The patient was scanned on a [employer] scanner. Axial non-contrast 3 mm slices were carried out through the heart. The data set was analyzed on a dedicated work station and scored using the Hughes. FINDINGS: Non-cardiac: See separate report from [facility]. Ascending Aorta: Normal size, no calcifications. Pericardium: Normal. Coronary arteries: Normal origin. IMPRESSION: Coronary calcium score of 0. This was 0 percentile for age and sex matched control. Electronically Signed: By: Ena Dawley On: 01/06/2019 17:23  ? ? ?Assessment & Plan:  ? ?Problem List Items Addressed This Visit    ? ? Vitamin D deficiency - Primary  ? Relevant Orders  ? VITAMIN D 25 Hydroxy (Vit-D Deficiency, Fractures) (Completed)  ? Well adult exam  ?   ?We discussed age appropriate health related issues, including available/recom

## 2021-08-07 NOTE — Assessment & Plan Note (Signed)
5/23 Resolved ?Labs - r/o chole ?Abd [place] offered ?Use Pepcid AC as needed for stomach ?

## 2021-08-14 ENCOUNTER — Other Ambulatory Visit: Payer: Self-pay | Admitting: Internal Medicine

## 2021-08-14 MED ORDER — VITAMIN D (ERGOCALCIFEROL) 1.25 MG (50000 UNIT) PO CAPS: 50000.0000 [IU] | ORAL_CAPSULE | ORAL | 0 refills | Status: AC

## 2022-03-28 ENCOUNTER — Encounter: Payer: Self-pay | Admitting: Internal Medicine

## 2022-03-28 ENCOUNTER — Ambulatory Visit (INDEPENDENT_AMBULATORY_CARE_PROVIDER_SITE_OTHER): Payer: 59 | Admitting: Internal Medicine

## 2022-03-28 VITALS — BP 122/76 | HR 80 | Temp 97.9°F | Ht 72.0 in | Wt 213.0 lb

## 2022-03-28 DIAGNOSIS — U071 COVID-19: Secondary | ICD-10-CM | POA: Insufficient documentation

## 2022-03-28 NOTE — Progress Notes (Signed)
Subjective:  Patient ID: Craig Atkins, male    DOB: [birth year]  Age: 56 y.o. MRN: [id number]  CC: Follow-up (Had covid about 2 weeks )   HPI Craig Atkins presents for COVID 19, got sick on Dec 5-6, 2023 Still having mild rhinorrhea    Outpatient Medications Prior to Visit  Medication Sig Dispense Refill   CVS D3 2000 units CAPS TAKE ONE CAPSULE BY MOUTH EVERY DAY 90 capsule 3   Cyanocobalamin 2500 MCG SUBL Use as directed in the mouth or throat daily.     Vitamin D, Ergocalciferol, (DRISDOL) 1.25 MG (50000 UNIT) CAPS capsule Take 1 capsule (50,000 Units total) by mouth every 7 (seven) days. 6 capsule 0   No facility-administered medications prior to visit.    ROS: Review of Systems  Constitutional:  Negative for appetite change, fatigue and unexpected weight change.  HENT:  Positive for rhinorrhea. Negative for congestion, nosebleeds, sneezing, sore throat and trouble swallowing.   Eyes:  Negative for itching and visual disturbance.  Respiratory:  Negative for cough.   Cardiovascular:  Negative for chest pain, palpitations and leg swelling.  Gastrointestinal:  Negative for abdominal distention, blood in stool, diarrhea and nausea.  Genitourinary:  Negative for frequency and hematuria.  Musculoskeletal:  Negative for back pain, gait problem, joint swelling and neck pain.  Skin:  Negative for rash.  Neurological:  Negative for dizziness, tremors, speech difficulty and weakness.  Psychiatric/Behavioral:  Negative for agitation, dysphoric mood and sleep disturbance. The patient is not nervous/anxious.     Objective:  BP 122/76 (BP Location: Left Arm, Patient Position: Sitting, Cuff Size: Normal)   Pulse 80   Temp 97.9 F (36.6 C) (Oral)   Ht 6' (1.829 m)   Wt 213 lb (96.6 kg)   SpO2 98%   BMI 28.89 kg/m   BP Readings from Last 3 Encounters:  03/28/22 122/76  08/07/21 110/80  02/10/20 110/70    Wt Readings from Last 3 Encounters:  03/28/22 213 lb (96.6 kg)   08/07/21 207 lb (93.9 kg)  02/10/20 209 lb 12.8 oz (95.2 kg)    Physical Exam Constitutional:      General: He is not in acute distress.    Appearance: Normal appearance. He is well-developed.     Comments: NAD  Eyes:     Conjunctiva/sclera: Conjunctivae normal.     Pupils: Pupils are equal, round, and reactive to light.  Neck:     Thyroid: No thyromegaly.     Vascular: No JVD.  Cardiovascular:     Rate and Rhythm: Normal rate and regular rhythm.     Heart sounds: Normal heart sounds. No murmur heard.    No friction rub. No gallop.  Pulmonary:     Effort: Pulmonary effort is normal. No respiratory distress.     Breath sounds: Normal breath sounds. No wheezing or rales.  Chest:     Chest wall: No tenderness.  Abdominal:     General: Bowel sounds are normal. There is no distension.     Palpations: Abdomen is soft. There is no mass.     Tenderness: There is no abdominal tenderness. There is no guarding or rebound.  Musculoskeletal:        General: No tenderness. Normal range of motion.     Cervical back: Normal range of motion.  Lymphadenopathy:     Cervical: No cervical adenopathy.  Skin:    General: Skin is warm and dry.     Findings: No rash.  Neurological:  Mental Status: He is alert and oriented to person, place, and time.     Cranial Nerves: No cranial nerve deficit.     Motor: No abnormal muscle tone.     Coordination: Coordination normal.     Gait: Gait normal.     Deep Tendon Reflexes: Reflexes are normal and symmetric.  Psychiatric:        Behavior: Behavior normal.        Thought Content: Thought content normal.        Judgment: Judgment normal.   Open you currently have them removed Craig Atkins.  Craig Atkins presents to the 3  Lab Results  Component Value Date   WBC 6.5 08/07/2021   HGB 15.7 08/07/2021   HCT 45.7 08/07/2021   PLT 211.0 08/07/2021   GLUCOSE 81 08/07/2021   CHOL 230 (H) 08/07/2021   TRIG 148.0 08/07/2021   HDL 42.70 08/07/2021    LDLDIRECT 155.0 09/16/2017   LDLCALC 158 (H) 08/07/2021   ALT 17 08/07/2021   AST 15 08/07/2021   NA 139 08/07/2021   K 4.0 08/07/2021   CL 104 08/07/2021   CREATININE 1.08 08/07/2021   BUN 15 08/07/2021   CO2 28 08/07/2021   TSH 0.49 08/07/2021   PSA 2.48 08/07/2021    CT CARDIAC SCORING  Addendum Date: 01/07/2019   ADDENDUM REPORT: 01/07/2019 08:14 EXAM: OVER-READ INTERPRETATION  CT CHEST The following report is an over-read performed by radiologist Dr. Samara Snide [facility], [place] on 01/07/2019. This over-read does not include interpretation of cardiac or coronary anatomy or pathology. The coronary calcium score interpretation by the cardiologist is attached. COMPARISON:  None. FINDINGS: Cardiovascular: Normal heart size. No significant pericardial effusion/thickening. Great vessels are normal in course and caliber. Mediastinum/Nodes: Unremarkable esophagus. No pathologically enlarged mediastinal or hilar lymph nodes, noting limited sensitivity for the detection of hilar adenopathy on this noncontrast study. Lungs/Pleura: No pneumothorax. No pleural effusion. No acute consolidative airspace disease or lung masses. 2 tiny right pulmonary nodules, largest 2 mm in the basilar right upper lobe along the minor fissure (series 3/image 12). Upper abdomen: No acute abnormality. Musculoskeletal: No aggressive appearing focal osseous lesions. Mild thoracic spondylosis. IMPRESSION: Two tiny right pulmonary nodules, largest 2 mm. No follow-up needed if patient is low-risk (and has no known or suspected primary neoplasm). Non-contrast chest CT can be considered in 12 months if patient is high-risk. This recommendation follows the consensus statement: Guidelines for Management of Incidental Pulmonary Nodules Detected on CT Images:From the Fleischner Society 2017; published online before print (10.1148/radiol.3220254270). Electronically Signed   By: Ilona Sorrel M.D.   On: 01/07/2019 08:14   Result  Date: 01/07/2019 CLINICAL DATA:  Risk stratification EXAM: Coronary Calcium Score MEDICATIONS: None TECHNIQUE: The patient was scanned on a [employer]. Axial non-contrast 3 mm slices were carried out through the heart. The data set was analyzed on a dedicated work station and scored using the Joy. FINDINGS: Non-cardiac: See separate report from [facility]. Ascending Aorta: Normal size, no calcifications. Pericardium: Normal. Coronary arteries: Normal origin. IMPRESSION: Coronary calcium score of 0. This was 0 percentile for age and sex matched control. Electronically Signed: By: Ena Dawley On: 01/06/2019 17:23      Assessment & Plan:   Problem List Items Addressed This Visit     COVID-19 - Primary    Recovering         No orders of the defined types were placed in this encounter.     Follow-up: Return  in about 4 months (around 07/28/2022) for Wellness Exam.  Walker Kehr, MD

## 2022-03-28 NOTE — Assessment & Plan Note (Signed)
Recovering  

## 2022-03-28 NOTE — Patient Instructions (Signed)
For a mild COVID-19 case - take zinc 50 mg a day for 1 week, vitamin C 1000 mg daily for 1 week, vitamin D2 50,000 units weekly for 2 months (unless  taking vitamin D daily already), an antioxidant Quercetin 500 mg twice a day for 1 week (if you can get it quick enough). Take Allegra or Benadryl.  Maintain good oral hydration and take Tylenol for high fever.  Call if problems. Isolate for 5 days, then wear a mask for 5 days per CDC.

## 2023-01-30 ENCOUNTER — Encounter: Payer: Self-pay | Admitting: Internal Medicine

## 2023-01-30 ENCOUNTER — Ambulatory Visit (INDEPENDENT_AMBULATORY_CARE_PROVIDER_SITE_OTHER): Payer: 59 | Admitting: Internal Medicine

## 2023-01-30 VITALS — BP 98/60 | HR 72 | Temp 98.0°F | Ht 72.0 in | Wt 209.0 lb

## 2023-01-30 DIAGNOSIS — J301 Allergic rhinitis due to pollen: Secondary | ICD-10-CM

## 2023-01-30 DIAGNOSIS — Z0000 Encounter for general adult medical examination without abnormal findings: Secondary | ICD-10-CM

## 2023-01-30 DIAGNOSIS — E559 Vitamin D deficiency, unspecified: Secondary | ICD-10-CM

## 2023-01-30 DIAGNOSIS — Z1211 Encounter for screening for malignant neoplasm of colon: Secondary | ICD-10-CM

## 2023-01-30 DIAGNOSIS — E538 Deficiency of other specified B group vitamins: Secondary | ICD-10-CM

## 2023-01-30 LAB — COMPREHENSIVE METABOLIC PANEL
ALT: 20 U/L (ref 0–53)
AST: 15 U/L (ref 0–37)
Albumin: 4.6 g/dL (ref 3.5–5.2)
Alkaline Phosphatase: 66 U/L (ref 39–117)
BUN: 17 mg/dL (ref 6–23)
CO2: 27 meq/L (ref 19–32)
Calcium: 9.5 mg/dL (ref 8.4–10.5)
Chloride: 106 meq/L (ref 96–112)
Creatinine, Ser: 0.91 mg/dL (ref 0.40–1.50)
GFR: 94.34 mL/min (ref >60.00)
Glucose, Bld: 93 mg/dL (ref 70–99)
Potassium: 3.8 meq/L (ref 3.5–5.1)
Sodium: 141 meq/L (ref 135–145)
Total Bilirubin: 0.4 mg/dL (ref 0.2–1.2)
Total Protein: 7.1 g/dL (ref 6.0–8.3)

## 2023-01-30 LAB — URINALYSIS
Bilirubin Urine: NEGATIVE
Hgb urine dipstick: NEGATIVE
Ketones, ur: NEGATIVE
Leukocytes,Ua: NEGATIVE
Nitrite: NEGATIVE
Specific Gravity, Urine: 1.02 (ref 1.000–1.030)
Total Protein, Urine: NEGATIVE
Urine Glucose: NEGATIVE
Urobilinogen, UA: 0.2 (ref 0.0–1.0)
pH: 7.5 (ref 5.0–8.0)

## 2023-01-30 LAB — CBC WITH DIFFERENTIAL/PLATELET
Basophils Absolute: 0.1 10*3/uL (ref 0.0–0.1)
Basophils Relative: 0.8 % (ref 0.0–3.0)
Eosinophils Absolute: 0.1 10*3/uL (ref 0.0–0.7)
Eosinophils Relative: 0.9 % (ref 0.0–5.0)
HCT: 47.7 % (ref 39.0–52.0)
Hemoglobin: 16 g/dL (ref 13.0–17.0)
Lymphocytes Relative: 29.7 % (ref 12.0–46.0)
Lymphs Abs: 2 10*3/uL (ref 0.7–4.0)
MCHC: 33.7 g/dL (ref 30.0–36.0)
MCV: 90.7 fL (ref 78.0–100.0)
Monocytes Absolute: 0.6 10*3/uL (ref 0.1–1.0)
Monocytes Relative: 8.5 % (ref 3.0–12.0)
Neutro Abs: 4.1 10*3/uL (ref 1.4–7.7)
Neutrophils Relative %: 60.1 % (ref 43.0–77.0)
Platelets: 223 10*3/uL (ref 150.0–400.0)
RBC: 5.26 Mil/uL (ref 4.22–5.81)
RDW: 13.5 % (ref 11.5–15.5)
WBC: 6.9 10*3/uL (ref 4.0–10.5)

## 2023-01-30 LAB — VITAMIN D 25 HYDROXY (VIT D DEFICIENCY, FRACTURES): VITD: 29.72 ng/mL — ABNORMAL LOW (ref 30.00–100.00)

## 2023-01-30 LAB — PSA: PSA: 2.7 ng/mL (ref 0.10–4.00)

## 2023-01-30 LAB — LIPID PANEL
Cholesterol: 231 mg/dL — ABNORMAL HIGH (ref 0–200)
HDL: 45.9 mg/dL (ref >39.00)
LDL Cholesterol: 144 mg/dL — ABNORMAL HIGH (ref 0–99)
NonHDL: 185.35
Total CHOL/HDL Ratio: 5
Triglycerides: 206 mg/dL — ABNORMAL HIGH (ref 0.0–149.0)
VLDL: 41.2 mg/dL — ABNORMAL HIGH (ref 0.0–40.0)

## 2023-01-30 LAB — TSH: TSH: 0.44 u[IU]/mL (ref 0.35–5.50)

## 2023-01-30 LAB — VITAMIN B12: Vitamin B-12: 322 pg/mL (ref 211–911)

## 2023-01-30 NOTE — Assessment & Plan Note (Signed)
Vit D 

## 2023-01-30 NOTE — Assessment & Plan Note (Signed)
Mild Claritin prn

## 2023-01-30 NOTE — Progress Notes (Signed)
Subjective:  Patient ID: Craig Atkins, male    DOB: 12/30/1965  Age: 57 y.o. MRN: [id number]  CC: Medical Management of Chronic Issues   HPI Craig Atkins presents for a well exam  Outpatient Medications Prior to Visit  Medication Sig Dispense Refill   CVS D3 2000 units CAPS TAKE ONE CAPSULE BY MOUTH EVERY DAY 90 capsule 3   Cyanocobalamin 2500 MCG SUBL Use as directed in the mouth or throat daily.     Vitamin D, Ergocalciferol, (DRISDOL) 1.25 MG (50000 UNIT) CAPS capsule Take 1 capsule (50,000 Units total) by mouth every 7 (seven) days. 6 capsule 0   No facility-administered medications prior to visit.    ROS: Review of Systems  Constitutional:  Negative for appetite change, fatigue and unexpected weight change.  HENT:  Negative for congestion, nosebleeds, sneezing, sore throat and trouble swallowing.   Eyes:  Negative for itching and visual disturbance.  Respiratory:  Negative for cough.   Cardiovascular:  Negative for chest pain, palpitations and leg swelling.  Gastrointestinal:  Negative for abdominal distention, blood in stool, diarrhea and nausea.  Genitourinary:  Negative for frequency and hematuria.  Musculoskeletal:  Negative for back pain, gait problem, joint swelling and neck pain.  Skin:  Negative for rash.  Neurological:  Negative for dizziness, tremors, speech difficulty and weakness.  Psychiatric/Behavioral:  Negative for agitation, dysphoric mood, sleep disturbance and suicidal ideas. The patient is not nervous/anxious.     Objective:  BP 98/60 (BP Location: Left Arm, Patient Position: Sitting, Cuff Size: Normal)   Pulse 72   Temp 98 F (36.7 C) (Oral)   Ht 6' (1.829 m)   Wt 209 lb (94.8 kg)   SpO2 94%   BMI 28.35 kg/m   BP Readings from Last 3 Encounters:  01/30/23 98/60  03/28/22 122/76  08/07/21 110/80    Wt Readings from Last 3 Encounters:  01/30/23 209 lb (94.8 kg)  03/28/22 213 lb (96.6 kg)  08/07/21 207 lb (93.9 kg)    Physical  Exam Constitutional:      General: He is not in acute distress.    Appearance: He is well-developed.     Comments: NAD  Eyes:     Conjunctiva/sclera: Conjunctivae normal.     Pupils: Pupils are equal, round, and reactive to light.  Neck:     Thyroid: No thyromegaly.     Vascular: No JVD.  Cardiovascular:     Rate and Rhythm: Normal rate and regular rhythm.     Heart sounds: Normal heart sounds. No murmur heard.    No friction rub. No gallop.  Pulmonary:     Effort: Pulmonary effort is normal. No respiratory distress.     Breath sounds: Normal breath sounds. No wheezing or rales.  Chest:     Chest wall: No tenderness.  Abdominal:     General: Bowel sounds are normal. There is no distension.     Palpations: Abdomen is soft. There is no mass.     Tenderness: There is no abdominal tenderness. There is no guarding or rebound.  Musculoskeletal:        General: No tenderness. Normal range of motion.     Cervical back: Normal range of motion.  Lymphadenopathy:     Cervical: No cervical adenopathy.  Skin:    General: Skin is warm and dry.     Findings: No rash.  Neurological:     Mental Status: He is alert and oriented to person, place, and time.  Cranial Nerves: No cranial nerve deficit.     Motor: No abnormal muscle tone.     Coordination: Coordination normal.     Gait: Gait normal.     Deep Tendon Reflexes: Reflexes are normal and symmetric.  Psychiatric:        Behavior: Behavior normal.        Thought Content: Thought content normal.        Judgment: Judgment normal.     Lab Results  Component Value Date   WBC 6.9 01/30/2023   HGB 16.0 01/30/2023   HCT 47.7 01/30/2023   PLT 223.0 01/30/2023   GLUCOSE 93 01/30/2023   CHOL 231 (H) 01/30/2023   TRIG 206.0 (H) 01/30/2023   HDL 45.90 01/30/2023   LDLDIRECT 155.0 09/16/2017   LDLCALC 144 (H) 01/30/2023   ALT 20 01/30/2023   AST 15 01/30/2023   NA 141 01/30/2023   K 3.8 01/30/2023   CL 106 01/30/2023    CREATININE 0.91 01/30/2023   BUN 17 01/30/2023   CO2 27 01/30/2023   TSH 0.44 01/30/2023   PSA 2.70 01/30/2023    CT CARDIAC SCORING  Addendum Date: 01/07/2019   ADDENDUM REPORT: 01/07/2019 08:14 EXAM: OVER-READ INTERPRETATION  CT CHEST The following report is an over-read performed by radiologist Dr. Lesia Hausen [facility] on 01/07/2019. This over-read does not include interpretation of cardiac or coronary anatomy or pathology. The coronary calcium score interpretation by the cardiologist is attached. COMPARISON:  None. FINDINGS: Cardiovascular: Normal heart size. No significant pericardial effusion/thickening. Great vessels are normal in course and caliber. Mediastinum/Nodes: Unremarkable esophagus. No pathologically enlarged mediastinal or hilar lymph nodes, noting limited sensitivity for the detection of hilar adenopathy on this noncontrast study. Lungs/Pleura: No pneumothorax. No pleural effusion. No acute consolidative airspace disease or lung masses. 2 tiny right pulmonary nodules, largest 2 mm in the basilar right upper lobe along the minor fissure (series 3/image 12). Upper abdomen: No acute abnormality. Musculoskeletal: No aggressive appearing focal osseous lesions. Mild thoracic spondylosis. IMPRESSION: Two tiny right pulmonary nodules, largest 2 mm. No follow-up needed if patient is low-risk (and has no known or suspected primary neoplasm). Non-contrast chest CT can be considered in 12 months if patient is high-risk. This recommendation follows the consensus statement: Guidelines for Management of Incidental Pulmonary Nodules Detected on CT Images:From the Fleischner Society 2017; published online before print (10.1148/radiol.4098119147). Electronically Signed   By: Delbert Phenix M.D.   On: 01/07/2019 08:14   Result Date: 01/07/2019 CLINICAL DATA:  Risk stratification EXAM: Coronary Calcium Score MEDICATIONS: None TECHNIQUE: The patient was scanned on a [employer].  Axial non-contrast 3 mm slices were carried out through the heart. The data set was analyzed on a dedicated work station and scored using the Agatson method. FINDINGS: Non-cardiac: See separate report from [facility]. Ascending Aorta: Normal size, no calcifications. Pericardium: Normal. Coronary arteries: Normal origin. IMPRESSION: Coronary calcium score of 0. This was 0 percentile for age and sex matched control. Electronically Signed: By: Tobias Alexander On: 01/06/2019 17:23    Assessment & Plan:   Problem List Items Addressed This Visit     Well adult exam    2020 CT Coronary calcium score of 0. This was 0 percentile for age and sex matched control. We discussed age appropriate health related issues, including available/recomended screening tests and vaccinations. Labs were ordered to be later reviewed . All questions were answered. We discussed one or more of the following - seat belt use, use  of sunscreen/sun exposure exercise, fall risk reduction, second hand smoke exposure, firearm use and storage, seat belt use, a need for adhering to healthy diet and exercise. Labs were ordered.  All questions were answered. Continue with B12 Eye exam every year, dental cleaning Cologuard ordered      Relevant Orders   TSH (Completed)   Urinalysis (Completed)   CBC with Differential/Platelet (Completed)   Lipid panel (Completed)   PSA (Completed)   Comprehensive metabolic panel (Completed)   Cologuard   Vitamin B12 (Completed)   VITAMIN D 25 Hydroxy (Vit-D Deficiency, Fractures) (Completed)   B12 deficiency - Primary    Re-start B12 Risks associated with treatment noncompliance were discussed. Compliance was encouraged.       Relevant Orders   Vitamin B12 (Completed)   Allergic rhinitis    Mild Claritin prn      Vitamin D deficiency    Vit D      Relevant Orders   VITAMIN D 25 Hydroxy (Vit-D Deficiency, Fractures) (Completed)   Other Visit Diagnoses     Screening  for colon cancer       Relevant Orders   Cologuard         No orders of the defined types were placed in this encounter.     Follow-up: Return in about 1 year (around 01/30/2024) for Wellness Exam.  Sonda Primes, MD

## 2023-01-30 NOTE — Assessment & Plan Note (Addendum)
2020 CT Coronary calcium score of 0. This was 0 percentile for age and sex matched control. We discussed age appropriate health related issues, including available/recomended screening tests and vaccinations. Labs were ordered to be later reviewed . All questions were answered. We discussed one or more of the following - seat belt use, use of sunscreen/sun exposure exercise, fall risk reduction, second hand smoke exposure, firearm use and storage, seat belt use, a need for adhering to healthy diet and exercise. Labs were ordered.  All questions were answered. Continue with B12 Eye exam every year, dental cleaning Cologuard ordered

## 2023-02-26 LAB — COLOGUARD: COLOGUARD: NEGATIVE
